# Patient Record
Sex: Male | Born: 1955 | Race: White | Hispanic: No | Marital: Married | Smoking: Current every day smoker
Health system: Southern US, Community
[De-identification: ages and names within clinical notes are randomized; demographics above are authoritative.]

## PROBLEM LIST (undated history)

## (undated) DIAGNOSIS — I2510 Atherosclerotic heart disease of native coronary artery without angina pectoris: Secondary | ICD-10-CM

## (undated) DIAGNOSIS — C649 Malignant neoplasm of unspecified kidney, except renal pelvis: Secondary | ICD-10-CM

## (undated) DIAGNOSIS — T7840XA Allergy, unspecified, initial encounter: Secondary | ICD-10-CM

## (undated) DIAGNOSIS — Z8551 Personal history of malignant neoplasm of bladder: Secondary | ICD-10-CM

## (undated) DIAGNOSIS — C679 Malignant neoplasm of bladder, unspecified: Secondary | ICD-10-CM

## (undated) DIAGNOSIS — Z0282 Encounter for adoption services: Secondary | ICD-10-CM

## (undated) DIAGNOSIS — Z85528 Personal history of other malignant neoplasm of kidney: Secondary | ICD-10-CM

## (undated) DIAGNOSIS — I209 Angina pectoris, unspecified: Secondary | ICD-10-CM

## (undated) DIAGNOSIS — E119 Type 2 diabetes mellitus without complications: Secondary | ICD-10-CM

## (undated) DIAGNOSIS — H269 Unspecified cataract: Secondary | ICD-10-CM

## (undated) DIAGNOSIS — Z789 Other specified health status: Secondary | ICD-10-CM

## (undated) DIAGNOSIS — I10 Essential (primary) hypertension: Secondary | ICD-10-CM

## (undated) HISTORY — DX: Allergy, unspecified, initial encounter: T78.40XA

## (undated) HISTORY — DX: Malignant neoplasm of bladder, unspecified: C67.9

## (undated) HISTORY — DX: Encounter for adoption services: Z02.82

## (undated) HISTORY — PX: CYSTOSCOPY: SHX5120

## (undated) HISTORY — DX: Malignant neoplasm of unspecified kidney, except renal pelvis: C64.9

## (undated) HISTORY — PX: NEPHRECTOMY: SHX65

## (undated) HISTORY — PX: BLADDER SURGERY: SHX569

## (undated) HISTORY — PX: INGUINAL HERNIA REPAIR: SUR1180

## (undated) HISTORY — DX: Other specified health status: Z78.9

## (undated) HISTORY — DX: Unspecified cataract: H26.9

## (undated) HISTORY — DX: Personal history of other malignant neoplasm of kidney: Z85.528

## (undated) HISTORY — DX: Personal history of malignant neoplasm of bladder: Z85.51

---

## 2001-08-08 ENCOUNTER — Encounter: Payer: Self-pay | Admitting: Urology

## 2001-08-08 ENCOUNTER — Encounter: Admission: RE | Admit: 2001-08-08 | Discharge: 2001-08-08 | Payer: Self-pay | Admitting: Urology

## 2001-08-14 ENCOUNTER — Observation Stay (HOSPITAL_COMMUNITY): Admission: RE | Admit: 2001-08-14 | Discharge: 2001-08-15 | Payer: Self-pay | Admitting: Urology

## 2001-08-14 ENCOUNTER — Encounter: Payer: Self-pay | Admitting: Urology

## 2001-08-14 ENCOUNTER — Encounter (INDEPENDENT_AMBULATORY_CARE_PROVIDER_SITE_OTHER): Payer: Self-pay | Admitting: Specialist

## 2001-09-11 ENCOUNTER — Encounter (INDEPENDENT_AMBULATORY_CARE_PROVIDER_SITE_OTHER): Payer: Self-pay | Admitting: Specialist

## 2001-09-11 ENCOUNTER — Observation Stay (HOSPITAL_COMMUNITY): Admission: RE | Admit: 2001-09-11 | Discharge: 2001-09-11 | Payer: Self-pay | Admitting: Urology

## 2001-11-20 ENCOUNTER — Encounter (INDEPENDENT_AMBULATORY_CARE_PROVIDER_SITE_OTHER): Payer: Self-pay | Admitting: Specialist

## 2001-11-20 ENCOUNTER — Ambulatory Visit (HOSPITAL_BASED_OUTPATIENT_CLINIC_OR_DEPARTMENT_OTHER): Admission: RE | Admit: 2001-11-20 | Discharge: 2001-11-20 | Payer: Self-pay | Admitting: Urology

## 2002-03-26 ENCOUNTER — Encounter (INDEPENDENT_AMBULATORY_CARE_PROVIDER_SITE_OTHER): Payer: Self-pay

## 2002-03-26 ENCOUNTER — Ambulatory Visit (HOSPITAL_BASED_OUTPATIENT_CLINIC_OR_DEPARTMENT_OTHER): Admission: RE | Admit: 2002-03-26 | Discharge: 2002-03-26 | Payer: Self-pay | Admitting: Urology

## 2003-01-14 ENCOUNTER — Ambulatory Visit (HOSPITAL_BASED_OUTPATIENT_CLINIC_OR_DEPARTMENT_OTHER): Admission: RE | Admit: 2003-01-14 | Discharge: 2003-01-14 | Payer: Self-pay | Admitting: Urology

## 2003-01-14 ENCOUNTER — Encounter (INDEPENDENT_AMBULATORY_CARE_PROVIDER_SITE_OTHER): Payer: Self-pay | Admitting: Specialist

## 2003-01-14 ENCOUNTER — Ambulatory Visit (HOSPITAL_COMMUNITY): Admission: RE | Admit: 2003-01-14 | Discharge: 2003-01-14 | Payer: Self-pay | Admitting: Urology

## 2004-08-17 ENCOUNTER — Encounter (INDEPENDENT_AMBULATORY_CARE_PROVIDER_SITE_OTHER): Payer: Self-pay | Admitting: *Deleted

## 2004-08-17 ENCOUNTER — Ambulatory Visit (HOSPITAL_COMMUNITY): Admission: RE | Admit: 2004-08-17 | Discharge: 2004-08-17 | Payer: Self-pay | Admitting: Urology

## 2006-05-30 ENCOUNTER — Ambulatory Visit (HOSPITAL_BASED_OUTPATIENT_CLINIC_OR_DEPARTMENT_OTHER): Admission: RE | Admit: 2006-05-30 | Discharge: 2006-05-30 | Payer: Self-pay | Admitting: Urology

## 2006-05-30 ENCOUNTER — Encounter (INDEPENDENT_AMBULATORY_CARE_PROVIDER_SITE_OTHER): Payer: Self-pay | Admitting: Specialist

## 2007-06-26 ENCOUNTER — Ambulatory Visit (HOSPITAL_BASED_OUTPATIENT_CLINIC_OR_DEPARTMENT_OTHER): Admission: RE | Admit: 2007-06-26 | Discharge: 2007-06-26 | Payer: Self-pay | Admitting: Urology

## 2007-06-26 ENCOUNTER — Encounter (INDEPENDENT_AMBULATORY_CARE_PROVIDER_SITE_OTHER): Payer: Self-pay | Admitting: Urology

## 2009-01-10 ENCOUNTER — Inpatient Hospital Stay (HOSPITAL_BASED_OUTPATIENT_CLINIC_OR_DEPARTMENT_OTHER): Admission: RE | Admit: 2009-01-10 | Discharge: 2009-01-10 | Payer: Self-pay | Admitting: Cardiology

## 2010-07-05 ENCOUNTER — Other Ambulatory Visit (HOSPITAL_COMMUNITY): Payer: Self-pay | Admitting: Urology

## 2010-07-05 ENCOUNTER — Ambulatory Visit (HOSPITAL_COMMUNITY)
Admission: RE | Admit: 2010-07-05 | Discharge: 2010-07-05 | Disposition: A | Discharge status: Home / Self Care | Payer: BC Managed Care – PPO | Source: Ambulatory Visit | Attending: Urology | Admitting: Urology

## 2010-07-05 DIAGNOSIS — D49519 Neoplasm of unspecified behavior of unspecified kidney: Secondary | ICD-10-CM

## 2010-07-05 DIAGNOSIS — D4959 Neoplasm of unspecified behavior of other genitourinary organ: Secondary | ICD-10-CM | POA: Insufficient documentation

## 2010-07-05 DIAGNOSIS — I10 Essential (primary) hypertension: Secondary | ICD-10-CM | POA: Insufficient documentation

## 2010-07-17 ENCOUNTER — Encounter (HOSPITAL_COMMUNITY): Payer: BC Managed Care – PPO

## 2010-07-17 ENCOUNTER — Other Ambulatory Visit: Payer: Self-pay | Admitting: Urology

## 2010-07-17 DIAGNOSIS — Z79899 Other long term (current) drug therapy: Secondary | ICD-10-CM | POA: Insufficient documentation

## 2010-07-17 DIAGNOSIS — D4959 Neoplasm of unspecified behavior of other genitourinary organ: Secondary | ICD-10-CM | POA: Insufficient documentation

## 2010-07-17 DIAGNOSIS — I10 Essential (primary) hypertension: Secondary | ICD-10-CM | POA: Insufficient documentation

## 2010-07-17 DIAGNOSIS — Z01812 Encounter for preprocedural laboratory examination: Secondary | ICD-10-CM | POA: Insufficient documentation

## 2010-07-17 DIAGNOSIS — Z8551 Personal history of malignant neoplasm of bladder: Secondary | ICD-10-CM | POA: Insufficient documentation

## 2010-07-17 DIAGNOSIS — Z01810 Encounter for preprocedural cardiovascular examination: Secondary | ICD-10-CM | POA: Insufficient documentation

## 2010-07-17 LAB — CBC
HCT: 42.7 % (ref 39.0–52.0)
Hemoglobin: 14.9 g/dL (ref 13.0–17.0)
MCH: 31.6 pg (ref 26.0–34.0)
MCHC: 34.9 g/dL (ref 30.0–36.0)
MCV: 90.7 fL (ref 78.0–100.0)
Platelets: 197 10*3/uL (ref 150–400)
RBC: 4.71 MIL/uL (ref 4.22–5.81)
RDW: 13 % (ref 11.5–15.5)
WBC: 8.8 10*3/uL (ref 4.0–10.5)

## 2010-07-17 LAB — BASIC METABOLIC PANEL
BUN: 7 mg/dL (ref 6–23)
CO2: 28 mEq/L (ref 19–32)
Calcium: 9.3 mg/dL (ref 8.4–10.5)
Chloride: 103 mEq/L (ref 96–112)
Creatinine, Ser: 0.94 mg/dL (ref 0.4–1.5)
GFR calc Af Amer: >60 mL/min (ref >60)
GFR calc non Af Amer: >60 mL/min (ref >60)
Glucose, Bld: 111 mg/dL — ABNORMAL HIGH (ref 70–99)
Potassium: 3.5 mEq/L (ref 3.5–5.1)
Sodium: 140 mEq/L (ref 135–145)

## 2010-07-17 LAB — SURGICAL PCR SCREEN: Staphylococcus aureus: POSITIVE — AB

## 2010-07-24 ENCOUNTER — Inpatient Hospital Stay (HOSPITAL_COMMUNITY)
Admission: RE | Admit: 2010-07-24 | Discharge: 2010-07-27 | DRG: 303 | Disposition: A | Discharge status: Home / Self Care | Payer: BC Managed Care – PPO | Source: Ambulatory Visit | Attending: Urology | Admitting: Urology

## 2010-07-24 ENCOUNTER — Other Ambulatory Visit: Payer: Self-pay | Admitting: Urology

## 2010-07-24 DIAGNOSIS — Z01818 Encounter for other preprocedural examination: Secondary | ICD-10-CM

## 2010-07-24 DIAGNOSIS — Z8551 Personal history of malignant neoplasm of bladder: Secondary | ICD-10-CM

## 2010-07-24 DIAGNOSIS — F17200 Nicotine dependence, unspecified, uncomplicated: Secondary | ICD-10-CM | POA: Diagnosis present

## 2010-07-24 DIAGNOSIS — C649 Malignant neoplasm of unspecified kidney, except renal pelvis: Principal | ICD-10-CM | POA: Diagnosis present

## 2010-07-24 DIAGNOSIS — I10 Essential (primary) hypertension: Secondary | ICD-10-CM | POA: Diagnosis present

## 2010-07-24 LAB — TYPE AND SCREEN
ABO/RH(D): O POS
Antibody Screen: NEGATIVE

## 2010-07-24 LAB — HEMOGLOBIN AND HEMATOCRIT, BLOOD
HCT: 39.9 % (ref 39.0–52.0)
Hemoglobin: 13.8 g/dL (ref 13.0–17.0)

## 2010-07-24 LAB — BASIC METABOLIC PANEL
BUN: 10 mg/dL (ref 6–23)
CO2: 27 mEq/L (ref 19–32)
Calcium: 8.4 mg/dL (ref 8.4–10.5)
Chloride: 101 mEq/L (ref 96–112)
Creatinine, Ser: 1.2 mg/dL (ref 0.4–1.5)
GFR calc Af Amer: >60 mL/min (ref >60)
GFR calc non Af Amer: >60 mL/min (ref >60)
Glucose, Bld: 178 mg/dL — ABNORMAL HIGH (ref 70–99)
Potassium: 4 mEq/L (ref 3.5–5.1)
Sodium: 136 mEq/L (ref 135–145)

## 2010-07-24 LAB — ABO/RH: ABO/RH(D): O POS

## 2010-07-25 LAB — BASIC METABOLIC PANEL
BUN: 10 mg/dL (ref 6–23)
CO2: 26 mEq/L (ref 19–32)
Calcium: 8.3 mg/dL — ABNORMAL LOW (ref 8.4–10.5)
Chloride: 101 mEq/L (ref 96–112)
Creatinine, Ser: 1.48 mg/dL (ref 0.4–1.5)
GFR calc Af Amer: 59 mL/min — ABNORMAL LOW (ref >60)
GFR calc non Af Amer: 49 mL/min — ABNORMAL LOW (ref >60)
Glucose, Bld: 177 mg/dL — ABNORMAL HIGH (ref 70–99)
Potassium: 4.6 mEq/L (ref 3.5–5.1)
Sodium: 134 mEq/L — ABNORMAL LOW (ref 135–145)

## 2010-07-25 LAB — HEMOGLOBIN AND HEMATOCRIT, BLOOD
HCT: 39.2 % (ref 39.0–52.0)
Hemoglobin: 13.2 g/dL (ref 13.0–17.0)

## 2010-07-26 LAB — BASIC METABOLIC PANEL
BUN: 10 mg/dL (ref 6–23)
CO2: 26 mEq/L (ref 19–32)
Calcium: 8.3 mg/dL — ABNORMAL LOW (ref 8.4–10.5)
Chloride: 100 mEq/L (ref 96–112)
Creatinine, Ser: 1.44 mg/dL (ref 0.4–1.5)
GFR calc Af Amer: >60 mL/min (ref >60)
GFR calc non Af Amer: 51 mL/min — ABNORMAL LOW (ref >60)
Glucose, Bld: 162 mg/dL — ABNORMAL HIGH (ref 70–99)
Potassium: 3.8 mEq/L (ref 3.5–5.1)
Sodium: 134 mEq/L — ABNORMAL LOW (ref 135–145)

## 2010-07-26 LAB — HEMOGLOBIN AND HEMATOCRIT, BLOOD
HCT: 37.3 % — ABNORMAL LOW (ref 39.0–52.0)
Hemoglobin: 12.3 g/dL — ABNORMAL LOW (ref 13.0–17.0)

## 2010-07-26 LAB — CREATININE, FLUID (PLEURAL, PERITONEAL, JP DRAINAGE): Creat, Fluid: 1.4 mg/dL

## 2010-07-27 ENCOUNTER — Inpatient Hospital Stay (HOSPITAL_COMMUNITY): Payer: BC Managed Care – PPO

## 2010-08-01 NOTE — Op Note (Signed)
Grant Watkins, Grant Watkins NO.:  [id number]   MEDICAL RECORD NO.:  [id number]          PATIENT TYPE:  AMB   LOCATION:  [facility]                         FACILITY:  [facility]   PHYSICIAN:  Heloise Purpura, MD      DATE OF BIRTH:  1955/05/24   DATE OF PROCEDURE:  06/26/2007  DATE OF DISCHARGE:                               OPERATIVE REPORT   PREOPERATIVE DIAGNOSIS:  Bladder cancer.   POSTOPERATIVE DIAGNOSIS:  Bladder cancer.   PROCEDURE:  1. Cystoscopy.  2. Bilateral retrograde pyelography.  3. Examination under anesthesia.  4. Transurethral resection of bladder tumor (1 cm).   SURGEON:  Heloise Purpura, MD   ASSISTANT:  Dr. Delman Kitten.   ANESTHESIA:  General.   COMPLICATIONS:  None.   ESTIMATED BLOOD LOSS:  Minimal.   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  On surveillance  cystoscopy, he was recently found to have a solitary recurrence.  This  did appear to be a low grade lesion and initially at the patient's  request was monitored.  This lesion did increase in size on his last  cystoscopy and he did elect to proceed with transurethral resection and  the above procedures.  Potential risks, complications, and alternative  options were discussed with the patient in detail and informed consent  was obtained.   DESCRIPTION OF PROCEDURE:  The patient was taken to the operating room  and a general anesthetic was administered.  He was placed in the dorsal  lithotomy position, administered preoperative antibiotics, and prepped  and draped in the usual sterile fashion.  A preoperative time-out was  then performed.  Cystourethroscopy was then performed which demonstrated  a normal anterior and posterior urethra.  Examination of the bladder  demonstrated the ureteral orifices to be in a normal anatomic position  and effluxing clear urine.  Examination of the bladder was then  systematically  performed and demonstrated a solitary papillary lesion on  the right anterior aspect of the bladder toward the bladder neck at  approximately 11 o'clock.  This lesion measured approximately 1 cm.  There were no other bladder tumors or other mucosal pathology identified  and there was no evidence for carcinoma in situ.  He did undergo a  previous bladder washing in the office that was performed that was  analyzed for cytology and was negative for malignancy.  Attention then  turned to the ureteral orifices and bilateral retrograde pyelography was  performed.  The right ureteral orifice was intubated with a cone-tip  catheter and contrast was injected.  There was no evidence of filling  defects or other abnormalities along the right ureter or collecting  system of the right kidney.  An identical procedure was then performed  on contralateral side and again no filling defects, dilation, or other  abnormalities were noted along the course of the ureter or left renal  collecting system.  Attention then returned to the bladder and the 24-  French resectoscope sheath was inserted into the patient's bladder.  Using a cutting  loop, the patient's bladder tumor was then  transurethrally resected including resection down into the muscularis  propria of the bladder.  This specimen was removed and sent off for  permanent pathologic analysis.  Reinspection of the bladder did not  demonstrate any significant bleeding.  The base of the bladder tumor was  then fulgurated and the bladder was emptied and reinspected and there  was no evidence of bleeding.  The patient's bladder was then emptied and  an examination under anesthesia was performed which did not demonstrate  any pelvic or bladder mass.  The patient appeared to tolerate the  procedure well without complications.  A 20-French catheter was inserted  into his bladder at the end of the procedure and following transfer to  the recovery unit, he did  have a postoperative installation EOquin  versus placebo 40 mL instilled via his Foley catheter into the bladder  which was left indwelling for 1 hour.      Heloise Purpura, MD  Electronically Signed     LB/MEDQ  D:  06/26/2007  T:  06/26/2007  Job:  [id number]

## 2010-08-04 NOTE — Op Note (Signed)
[facility]  Patient:    Grant Watkins, Grant Watkins Visit Number: [id number] MRN: [id number]          Service Type: SUR Location: 3W 0378 02 Attending Physician:  Monica Becton Dictated by:   Claudette Laws, M.D. Proc. Date: 08/14/01 Admit Date:  08/14/2001 Discharge Date: 08/15/2001                             Operative Report  PREOPERATIVE DIAGNOSIS:  Large, bulky papillary bladder carcinoma.  POSTOPERATIVE DIAGNOSIS:  Large, bulky papillary bladder carcinoma.  OPERATION:  Cystoscopy and transurethral resection and fulguration bladder tumor.  SURGEON:  Claudette Laws, M.D.  HISTORY:  This is a 55 year old, otherwise healthy gentleman, who presented with an episode of painless gross hematuria.  IVP revealed large filling defect occupying most of the bladder, normal upper tracts.  Subsequent CT scan showed a good 5 cm tumor along the right lateral wall with no hydronephrosis, no obvious adenopathy.  The patient comes in now for transurethral resection and further staging of the tumor.  He understands that because of the bulk of the tumor, it may take more than one procedure to resect it and also get down to bladder muscle.  The patient otherwise is in good health, although he is a heavy smoker.  DESCRIPTION OF PROCEDURE:  The patient was prepped and draped in the dorsal lithotomy position under intubated general anesthesia.  Bimanual exam did not reveal any bladder mass that I could palpate.  No obvious extension or fixed pelvis.  Cystoscopy revealed a normal anterior urethra and nonobstructing prostate.  Examination of the bladder revealed the previously described tumor which occupied most of the right lateral wall, extending over into the midline and I believe down to the right ureteral orifice.  There were some dilated vessels in this area.  The rest of the bladder looked normal.  Starting out the the Olympus continuous-flow resectoscope sheath  and the Erbe coagulator, transurethral resection ensued.  After about 1 hour and 10 minutes because of the fatigue factor and also some malfunctioning of the cutting loop on the Erbe, we elected to terminate the procedure.  I then converted over to a roller ball electrode and fulgurated as much of the tumor as I could.  It appeared to be a broad-based tumor but, at this point, I am not sure whether it is invasive or not.  A #22 French 5 cc Foley catheter was hooked to straight drain; it irrigated fairly clear.  A B&O suppository was placed per rectum for bladder spasms, and the patient was then taken back to the recovery room in satisfactory condition.  The bulky, superficial part of the tumor was sent for pathologic examination. Dictated by:   Claudette Laws, M.D. Attending Physician:  Monica Becton DD:  08/14/01 TD:  08/15/01 Job: [id number]

## 2010-08-04 NOTE — Discharge Summary (Signed)
[facility]  Patient:    Grant Watkins, Grant Watkins Visit Number: [id number] MRN: [id number]          Service Type: SUR Location: 3W 0378 02 Attending Physician:  Monica Becton Dictated by:   Claudette Laws, M.D. Admit Date:  08/14/2001 Discharge Date: 08/15/2001   CC:         Jamesetta Geralds, M.D.   Discharge Summary  HISTORY OF PRESENT ILLNESS:  This is a 55 year old gentleman who recently presented with an episode of painless, gross hematuria.  Urology workup confirmed a large bladder tumor occupying the right lateral wall.  Upper tracts were normal on IVP.  A preop CT scan showed no obvious adenopathy, no obvious invasion through the bladder wall.  The fat planes appeared normal. The patient otherwise is in good health accept for being a cigarette smoker x30 years.  He came in as an a.m. admission for overnight observation following TUR of his bladder tumor.  LABORATORY DATA AND X-RAY FINDINGS:  EKG showed normal sinus rhythm.  Chest x-ray report is not on the chart at the time of this dictation.  Electrolytes were normal with a BUN of 17 and creatinine 1.2.  Hemoglobin 16.7, hematocrit 47.1.  HOSPITAL COURSE:  The patient came in on Aug 14, 2001, and underwent transurethral and fulguration of a very large, 5-6 cm bladder tumor.  We debulked the tumor as much as we could and then at the end finished with with fulguration with a roller ball.  At this time, it was felt for technical reasons, that we should abandon the procedure and bring in back in a few weeks for further staging to include any evidence of obvious muscle invasion.  This was a large papillary tumor, broad based and I am still unclear if there is muscle invasion or not.  The patient was observed over night.  He had some bladder spasm, but by the next morning the urine was clear.  He was then sent home early in the morning on Aug 15, 2001, with instructions to remove the Foley catheter  himself in another 24 hours.  I will then make plans to bring him back in another two to three weeks to complete his resection.  He was encouraged to stop smoking in the meantime and use the incentive spirometry.  DISCHARGE DIAGNOSES: 1. Large papillary tumor, right lateral low, stage and grade pending. 2. Heavy cigarette smoker.  PROCEDURES: 1. Cystoscopy. 2. Transurethral resection and fulguration of large bladder tumor.  COMPLICATIONS:  None.  CONDITION ON DISCHARGE:  Recovering.  DISCHARGE MEDICATIONS: 1. Cipro 250 mg b.i.d. 2. Pyridium 200 mg #20 p.r.n. burning. 3. Tylox #25 p.r.n. pain. 4. Oxybutynin 5 mg b.i.d., #20 for bladder spasms.  DIET:  Regular diet.  Force fluids.  ACTIVITY:  Limit activity.  SPECIAL INSTRUCTIONS:  Remove the catheter in 24 hours himself and then return to see me in three weeks for followup and set up the followup surgery. Dictated by:   Claudette Laws, M.D. Attending Physician:  Monica Becton DD:  08/15/01 TD:  08/18/01 Job: [id number]

## 2010-08-04 NOTE — Op Note (Signed)
Grant Watkins, CAPPELLA              ACCOUNT NO.:  [id number]   MEDICAL RECORD NO.:  [id number]          PATIENT TYPE:  AMB   LOCATION:  DAY                          FACILITY:  [facility]   PHYSICIAN:  Claudette Laws, M.D.  DATE OF BIRTH:  Nov 19, 1955   DATE OF PROCEDURE:  08/17/2004  DATE OF DISCHARGE:                                 OPERATIVE REPORT   PREOPERATIVE DIAGNOSES:  1.  Medium size papillary bladder tumor dome of the bladder.  2.  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  3.  Heavy cigarette smoker.   POSTOPERATIVE DIAGNOSES:  1.  Medium size papillary bladder tumor dome of the bladder.  2.  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  3.  Heavy cigarette smoker.   OPERATION:  Cystoscopy, cold cup biopsy bladder tumor and transurethral  resection bladder tumor with the Bugbee electrode.   SURGEON:  Claudette Laws, M.D.   DESCRIPTION OF PROCEDURE:  The patient was prepped and draped in the dorsal  lithotomy position under intubated general anesthesia. Cystoscopy was  performed with the 22 French rigid cystoscope using the 12 degree and 70  degree lens. He had a normal anterior urethra, a prostate that was not  obstructing but with a high posterior lip. The bladder itself was normal  except for this medium sized tumor right at the dome of the bladder just  inside the bladder neck area. This appeared to be a superficial papillary  lesion. The rest of the bladder looked normal, smooth, no other suspicious  areas, normal ureteral orifices.   Initially using the cold cup forceps, we performed some biopsies. This tumor  proved to be somewhat inaccessible due to its location. However, we did  obtain some specimens. I then switched over to a Bugbee electrode and at the  25 degree coag and using the Aberrans lens, we were able to access the tumor  with suprapubic pressure and extensively fulgurated this  tumor right down to  its base. At the conclusion the irrigant was clear.   I then inserted a 22 French three-way Foley catheter, drained the bladder  and then instilled the 40 mg of mitomycin C in 40 mL of sterile water. The  catheter ports were plugged and he was taken back to the PACU in  satisfactory condition.   The plan is to keep the installation in for one hour and then irrigate the  bladder with 1 liter of saline and then remove the Foley and send him home.   Also preoperatively, he was given a B&O suppository for anesthetic purposes.      RFS/MEDQ  D:  08/17/2004  T:  08/17/2004  Job:  [id number]

## 2010-08-04 NOTE — Op Note (Signed)
TNAMEKHAMERON, GRUENWALD                         ACCOUNT NO.:  [id number]   MEDICAL RECORD NO.:  [id number]                   PATIENT TYPE:  AMB   LOCATION:  [facility]                                 FACILITY:  [facility]   PHYSICIAN:  Claudette Laws, M.D.               DATE OF BIRTH:  Mar 10, 1956   DATE OF PROCEDURE:  11/20/2001  DATE OF DISCHARGE:                                 OPERATIVE REPORT   PREOPERATIVE DIAGNOSES:  1. Recurrent papillary bladder tumor.  2. Status post transurethral resection bladder tumor x 2.   POSTOPERATIVE DIAGNOSES:  1. Recurrent papillary bladder tumor.  2. Status post transurethral resection bladder tumor x 2.   OPERATION:  1. Cystoscopy.  2. Cold cup biopsy, bladder tumor left lateral wall and transurethral     resection.   SURGEON:  Claudette Laws, M.D.   DESCRIPTION OF PROCEDURE:  The patient was prepped and draped in the dorsal  lithotomy position.  Under LMA anesthesia, a 22 French cystoscope was used  with the camera.  Cystoscopy showed a normal anterior urethra, a small,  nonobstructing prostate, some elevation of the posterior lip.  The bladder  itself showed a medium-size bladder tumor along the left lateral wall which  I thought was a new finding.  He also had some dystrophic changes along the  right lateral wall over the old TUR site.  There was a tumor just inside the  trigone, below the right ureteral orifice.  On this occasion, I was able to  identify both orifices without any difficulty.  Appropriate pictures were  taken.  The rest of the bladder looked normal, smooth.  No other tumors were  noted, and he appeared to have good results so far to date, considering the  size of the original tumor.   Initially, I cold cupped the tumor from the left lateral wall and then after  dilating him with Sissy Hoff sounds to a #28 [place], I put in a 27 [place]  continuous-flow resectoscope sheath and using the right-angle loop, resected  the tumor deep  into the muscle and then fulgurated the area around it.  Our  attention was then turned the confluent, flat area distal to the right  ureteral orifice which we fulgurated.  This appeared to be a superficial low-  grade tumor.  There was still some dystrophic-type calcification above the  right ureteral orifice, and we fulgurated this area gently and removed the  eschar.  At the conclusion, I put an 4 French 10 cc Foley catheter along  with some Xylocaine jelly per urethra.  A B&O suppository was placed per  rectum for bladder spasm.  The patient was then taken back to the recovery  room in satisfactory condition.  Claudette Laws, M.D.    RFS/MEDQ  D:  11/20/2001  T:  11/20/2001  Job:  [id number]

## 2010-08-04 NOTE — Op Note (Signed)
   Grant Watkins, REETZ                          ACCOUNT NO.:  [id number]   MEDICAL RECORD NO.:  [id number]                   PATIENT TYPE:  AMB   LOCATION:  [facility]                                 FACILITY:  [facility]   PHYSICIAN:  Claudette Laws, M.D.               DATE OF BIRTH:  10-02-1955   DATE OF PROCEDURE:  03/26/2002  DATE OF DISCHARGE:                                 OPERATIVE REPORT   PREOPERATIVE DIAGNOSIS:  Recurrent papillary bladder carcinoma.   POSTOPERATIVE DIAGNOSES:  1. Recurrent papillary bladder carcinoma.  2. Status post six weeks of BCG plus Entron.   PROCEDURES:  1. Cystoscopy  2. Cold cup biopsy, bladder tumor.  3. Fulguration of multiple papillary lesions.   DESCRIPTION OF PROCEDURE:  The patient was prepped and draped in the dorsal  lithotomy position under LMA anesthesia after injecting a B&O suppository  per rectum for anesthetic purposes.  Cystourethroscopy was performed with a  20 French cystoscope using a camera.  He had a normal anterior urethra, a  small nonobstructing prostate, a wide-open bladder neck.  The bladder itself  showed evidence of prior resections from his multiple TUR's.  However, he  was noted to have four to five papillary lesions, three up near the dome  along the right lateral wall, one more posteriorly in the midline, and one  at the 9 o'clock position just inside the bladder neck area.  We biopsied  one of these lesions.  They all appeared to be superficial lesions.  We then  using the Bugbee electrode at 40 coag fulgurated all these lesions using the  camera.  The rest of the bladder looked normal.  No other suspicious areas.  Normal ureteral orifices.  At the conclusion, the bladder was emptied.  I  injected Xylocaine jelly per urethra for anesthetic purposes again, and he  was taken back to the recovery room in satisfactory condition.   The plan now is to put him back on this bladder study with BCG, Entron, and  multiple  vitamins.                                                 Claudette Laws, M.D.    RFS/MEDQ  D:  03/26/2002  T:  03/26/2002  Job:  [id number]

## 2010-08-04 NOTE — Op Note (Signed)
[facility]  Patient:    DALESSANDRO, BALDYGA Visit Number: [id number] MRN: [id number]          Service Type: SUR Location: 3W 0352 01 Attending Physician:  Monica Becton Dictated by:   Claudette Laws, M.D. Proc. Date: 09/11/01 Admit Date:  09/11/2001 Discharge Date: 09/11/2001                             Operative Report  PREOPERATIVE DIAGNOSIS:  Persistent medium-sized papillary bladder tumor, right lateral wall.  POSTOPERATIVE DIAGNOSIS:  Persistent medium-sized papillary bladder tumor, right lateral wall.  OPERATION: 1. Cystoscopy. 2. Transurethral resection and fulguration bladder tumor.  SURGEON:  Claudette Laws, M.D.  HISTORY:  This is a 55 year old man, who was here about a month ago and found to have a large papillary tumor, occupying the right lateral wall of the bladder and extending into the lumen about halfway.  We resected out as much as we could at that time comfortably.  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  He comes back now to complete the resection, hopefully at the second setting.  He understands the procedure and complications, including possible bladder perforation and bleeding.  DESCRIPTION OF PROCEDURE:  The patient was prepped and draped in the dorsal lithotomy position under LMA anesthesia.  Cystoscopy was performed, again showing some persistent tumor along the right lateral wall, extending just above the right ureteral orifice, up not quite to the dome of the bladder. Appropriate pictures were taken with the Sony camera.  We then converted over to an Olympus continuous-flow resectoscope sheath along with the Erbe generator.  The initial setting was 120 with a #1 effect.  Transurethral resection was then performed, taking the rest of the tumor down to the bladder wall.  This again appeared to be a superficial lesion, at least cystoscopically.  Three  different specimens were sent.  Number one was the initial superficial area.  Number two was a little deeper toward the base of the tumor, and then number three was into the muscle of the bladder.  At the end, we fulgurated all areas.  There was minimal bleeding.  A #20 French 8 cc Foley catheter was hooked to straight drain, and a B&O suppository was placed per rectum for bladder spasms.  The patient was then taken back to the recovery room in satisfactory condition. Dictated by:   Claudette Laws, M.D. Attending Physician:  Monica Becton DD:  09/11/01 TD:  09/13/01 Job: [id number]

## 2010-08-04 NOTE — Op Note (Signed)
Grant Watkins, Grant Watkins              ACCOUNT NO.:  [id number]   MEDICAL RECORD NO.:  [id number]          PATIENT TYPE:  AMB   LOCATION:  [facility]                         FACILITY:  [facility]   PHYSICIAN:  Heloise Purpura, MD      DATE OF BIRTH:  [birth year]   DATE OF PROCEDURE:  05/30/2006  DATE OF DISCHARGE:                               OPERATIVE REPORT   PREOPERATIVE DIAGNOSIS:  1. Prostate nodule.  2. History of bladder cancer with bladder tumor recurrence.   POSTOPERATIVE DIAGNOSIS:  1. Prostate nodule.  2. History of bladder cancer with bladder tumor recurrence.   PROCEDURE:  1. Cystoscopy.  2. Bladder biopsy.  3. Transrectal ultrasound of the prostate.  4. Prostate needle biopsy.   SURGEON:  Heloise Purpura, M.D.   ANESTHESIA:  General.   COMPLICATIONS:  None.   ESTIMATED BLOOD LOSS:  Minimal.   PATHOLOGY SPECIMENS:  1. Bladder biopsy.  2. Prostate biopsies from the left apex, left mid, left base, right      apex, right mid, and right base.  All specimens were sent to      pathology.   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  He has been managed  with intermittent transurethral resection by Dr. Etta Watkins in the past.  On  recent surveillance cystoscopy, I did notice a possible recurrence along  the left lateral bladder wall.  In addition, he was noted to have a  prostate nodule.  His PSA was 0.8.  After discussing these findings with  the patient, he elected to proceed with the above procedures.  The  potential risks and benefits were discussed with the patient and he  consented.   DESCRIPTION OF PROCEDURE:  The patient was taken to the operating room  and a general anesthetic was administered.  He was given preoperative  antibiotics, placed in the dorsal lithotomy position, prepped and draped  in the usual sterile fashion.  Next, a preoperative time-out was  performed.  Cystourethroscopy was  performed which demonstrated a normal  anterior and posterior urethra with mild lateral lobe enlargement of the  prostate.  The ureteral orifices were noted to be in the normal anatomic  position and effluxing clear urine.  The bladder was then examined  systematically.  There was noted to be a small papillary bladder tumor  on the left lateral wall of the bladder.  The remainder of the bladder  appeared to be free of tumors or other mucosal abnormalities.  The cold  cup biopsy forceps were then used to resect this entire lesion.  The  Bugbee electrode was then used fulgurate the base of this lesion and the  surrounding areas.  The patient's bladder was then emptied and attention  turned to the prostate biopsy.  The transrectal ultrasound probe was  placed into the rectum and the prostate was visualized.  There was noted  to be a large calcification toward the right mid/base of the prostate.  This corresponded to the side of the patient's abnormality on rectal  exam.  12 systematic biopsies were performed from the prostate at the  right base, right mid, right apex, left base, left mid, and left apex.  Prostate volume was measured at 27.6 mL.  The patient appeared to  tolerate this procedure well without complications.  Sterile gloves were  then replaced. I again performed cystoscopy to re-examine the patient's  bladder.  There was one small blood clot which was removed.  The bladder  was then emptied and the procedure was ended.  The patient appeared to  tolerate the procedure well without complications.  He was able to be  awakened and transferred to the recovery unit in satisfactory condition.           ______________________________  Heloise Purpura, MD  Electronically Signed     LB/MEDQ  D:  05/30/2006  T:  05/31/2006  Job:  [id number]

## 2010-08-04 NOTE — Op Note (Signed)
Grant Watkins, Grant Watkins                          ACCOUNT NO.:  [id number]   MEDICAL RECORD NO.:  [id number]                   PATIENT TYPE:  AMB   LOCATION:  [facility]                                 FACILITY:  [facility]   PHYSICIAN:  Claudette Laws, M.D.               DATE OF BIRTH:  1955/12/03   DATE OF PROCEDURE:  01/14/2003  DATE OF DISCHARGE:                                 OPERATIVE REPORT   PREOPERATIVE DIAGNOSES:  1. Multiple bladder tumors.  2. 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  3. Status post BCG/Intron intravesical instillations.   POSTOPERATIVE DIAGNOSES:  1. Multiple bladder tumors.  2. 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  3. Status post BCG/Intron intravesical instillations.   OPERATION:  1. Cystoscopy and cold-cut biopsy of bladder tumors.  2. Transurethral resection of bladder tumors.   SURGEON:  Claudette Laws, M.D.   PROCEDURE:  The patient was prepped and draped in the dorsal lithotomy  position under LMA anesthesia.  Cystoscopy was performed, revealing a normal  anterior urethra, some early BPH.  He had a papillary-like lesion at the 3  o'clock position, just inside the bladder neck area.  This appeared to be on  a stalk.  There was also a larger (about 2.5 cm) tumor up around the 11  o'clock position; again, just up the bladder neck area.  The rest of the  bladder looked normal.  The ureteral orifices were unremarkable.   Initially, using a cold-cut biopsy, I biopsied the lesion near the dome in  the bladder neck area.  This was labeled #1.  Then #2 was the superficial  tumor at the bladder neck area.   I then dilated the urethra with Sissy Hoff sounds.  He did have a slight  false passage at about the 3 o'clock position in the mid urethral area -- we  were able to bypass this.  Following dilation with Sissy Hoff sounds to a 32-  [place], a  26-French continuous flow Olympus scope resectoscope sheath was  then placed into the bladder.  Initially I resected out the lesion at the 3  o'clock position.  This seemed to be, again, a superficial area.  I  fulgurated the base thoroughly for about one centimeter around it.  We did  resect some of the bladder neck area in getting out this lesion.   Our attention was then turned to the lesion up near the dome, just inside  the bladder neck at about the 11 o'clock position.  Again, this was more  difficult to reach.  I then switched to a bladder wall loop, resected out  this area.  We did take some deep bites into the muscle.  I fulgurated the  base.   At this point I thought we had fulgurated the  area thoroughly, so a 22-  French 10 cc Foley catheter was placed into the bladder.  It irrigated well.  Slightly bloody urine.  At this point I thought I would put in mitomycin C,  but I thought I would wait for now since he had some bleeding and possibly  instill it at another date.  The patient was then taken back to the recovery  room in satisfactory condition, after placing a B&O suppository for bladder  spasm.                                               Claudette Laws, M.D.    RFS/MEDQ  D:  01/14/2003  T:  01/14/2003  Job:  [id number]

## 2010-08-09 NOTE — Op Note (Signed)
[name], [name]              ACCOUNT NO.:  [id number]  MEDICAL RECORD NO.:  [id number]           PATIENT TYPE:  I  LOCATION:  1442                         FACILITY:  [facility]  PHYSICIAN:  Heloise Purpura, MD      DATE OF BIRTH:  May 17, 1955  DATE OF PROCEDURE:  DATE OF DISCHARGE:  07/27/2010                              OPERATIVE REPORT   PREOPERATIVE DIAGNOSES: 1. Right renal neoplasm. 2. History of urothelial carcinoma of the bladder.  POSTOPERATIVE DIAGNOSES: 1. Right renal neoplasm. 2. History of urothelial carcinoma of the bladder.  PROCEDURE:  Right laparoscopic nephroureterectomy.  SURGEON:  Heloise Purpura, M.D.  ASSISTANT:  Delia Chimes, [facility]  ANESTHESIA:  General.  COMPLICATIONS:  None.  ESTIMATED BLOOD LOSS:  300 cc.  SPECIMEN:  Right kidney, ureter and bladder cuff.  DISPOSITION:  Specimen to pathology.  DRAINS:  #19 Blake pelvic drain.  INDICATION:  [name] is a 55 year old gentleman with a history of urothelial carcinoma of the bladder.  He recently underwent staging evaluation of his upper urinary tract including a CT scan, which revealed an enhancing right renal mass concerning for renal malignancy. He underwent metastatic evaluation which was negative and after reviewing treatment options, he elected to proceed with a right laparoscopic radical nephrectomy.  However, based on his history of urothelial carcinoma, I did recommend removal of his right ureter as well so as to make surveillance for his urothelial carcinoma easier in the future.  The potential risks, complications, and alternative treatment options associated with the above procedure were discussed in detail and informed consent was obtained.  DESCRIPTION OF PROCEDURE:  The patient was taken to the operating room and a general anesthetic was administered.  He was given preoperative antibiotics, placed in the right modified flank position, and prepped and draped in the usual sterile  fashion.  Care was taken to pad all potential pressure points.  Next, preoperative time-out was performed. A site was selected just superior to the umbilicus for placement of the camera port.  This was placed using a standard open Hasson technique, which allowed entry into the peritoneal cavity under direct vision without difficulty.  0 Vicryl holding sutures were then placed in the rectus fascia and a 12-mm Hasson cannula was placed and secured with these sutures.  Using the 30-degree lens, the abdomen was inspected and there was no evidence of any intraabdominal injuries or other abnormalities.  The remaining ports were then placed with a 5 mm port placed in the right upper quadrant and 12-mm port placed in the right lower quadrant and a 5 mm port placed in the right lateral abdominal wall for liver retraction.  A self-retaining liver retractor was placed due to the fact that this did overlie almost the entire kidney.  The white line of Toldt was then incised along the length of the ascending colon using the harmonic ultrasonic shears.  This allowed the space between the colonic mesentery and the anterior layer of Gerota fascia to be developed.  The ureter was identified and was able to be lifted anteriorly and the posterior plane between the kidney and the psoas muscle  was developed.  Dissection then proceeded superiorly toward the main renal hilum.  There were noted to be small accessory renal veins, which were identified.  There was some bleeding noted from one of these veins initially, which was controlled with 5-mm Hem-o-lok clips. Further dissection of the renal hilum allowed the main renal artery and renal vein to be isolated.  The renal artery was then ligated and divided between multiple 10-mm Hem-o-lok clips.  The renal vein was stapled and divided with a 45 mm Flex ETS stapler.  The hepatorenal ligaments and posterior attachments of the kidney as well as the  lateral attachments of the kidney were then taken down with a harmonic scalpel, thereby freeing the kidney specimen.  The renal fossa and renal hilar areas were then examined and hemostasis appeared excellent at this point.  Attention then turned to the ureter.  The  ureter was further dissected down to a point were crossed the iliac vessels.  At this point, the patient was repositioned in Trendelenburg position and an additional 12 mm port was placed in the lower midline.  The ureter was then dissected down to the level of the bladder laparoscopically.  A lower midline incision was then made and the kidney was brought out through this incision.  The aforementioned 12 mm port sites had been closed with 0 Vicryl sutures.  The bladder was then mobilized and the space of Retzius developed.  The ureter was then examined and was seen entering into the bladder where it was excised in an extravesicle fashion with a moderately sized bladder cuff.  The bladder hiatus was then closed with two running 2-0 Vicryl sutures in 2 layers.  The bladder was then filled with saline irrigation and the bladder appeared to be watertight without evidence for urine leak.  A #19 Blake drain was then brought through a separate stab incision in the right lower quadrant and positioned in the perivesical space.  The wound was then copiously irrigated and preparations were made for closure.  The lower midline incision was then closed with 2 running #1 PDS sutures and all skin incisions were reapproximated with staples.  Sterile dressings were applied.  The patient appeared to tolerate procedure well without complications.  He was able to be extubated and transferred to the recovery unit in satisfactory condition.     Heloise Purpura, MD     LB/MEDQ  D:  08/02/2010  T:  08/03/2010  Job:  [id number]  Electronically Signed by Heloise Purpura MD on 08/09/2010 09:18:35 PM

## 2010-08-21 NOTE — Discharge Summary (Signed)
[name], Watkins              ACCOUNT NO.:  [id number]  MEDICAL RECORD NO.:  [id number]           PATIENT TYPE:  I  LOCATION:  1442                         [facility]ACILITY:  [facility]  PHYSICIAN:  Heloise Purpura, MD      DATE OF BIRTH:  [birth year]  DATE OF ADMISSION:  07/24/2010 DATE OF DISCHARGE:  07/27/2010                              DISCHARGE SUMMARY   ADMISSION DIAGNOSES: 1. Right renal neoplasm. 2. History of urothelial carcinoma of the bladder.  DISCHARGE DIAGNOSES: 1. Right renal neoplasm. 2. History of urothelial carcinoma of the bladder.  PROCEDURE:  Right laparoscopic nephroureterectomy.  HOSPITAL COURSE:  For full details, please see admission history and physical.  Briefly, [name] is a 55 year old gentleman with a history of urothelial carcinoma of the bladder.  He recently underwent staging evaluation of his upper urinary tract including a CT scan which revealed an enhancing right renal mass concerning for renal malignancy.  He underwent metastatic evaluation which was negative and after reviewing treatment options he elected to proceed with surgical therapy and a right laparoscopic radical nephrectomy.  However, based on his history of urothelial carcinoma, Dr. Laverle Patter did recommend removal of his right ureter as well so to make surveillance for his urothelial carcinoma easier in the future.  After considering this, he did elect to proceed with surgical therapy and a right laparoscopic radical nephroureterectomy.  HOSPITAL COURSE:  On Jul 24, 2010, he was taken to the operating room where he underwent the above named procedures which he tolerated well without complications.  Postoperatively he was able to be transferred to regular hospital room following recovery from anesthesia.  He was able to be transitioned to clear liquids that evening.  He tolerated well and was able to be transitioned to a regular diet over the course of the next 24 to 48 hours.  He was  also able to begin ambulation that evening which he did with some encouragement but over the course of the next 24 to 48 hours did it without difficulty.  Postoperatively he was found to be hemodynamically stable as noted by postoperative hemoglobin of 13.8. He remained hemodynamically stable throughout his hospitalization as noted by hemoglobin prior to discharge of 12.3.  His serum creatinine did slightly increase from 1.20 postoperatively to 1.48 on postoperative day #1, on postoperative day #2 it did subsequently start to decrease to 1.44.  On the afternoon of postoperative day #2, he was found to have excellent urine output with minimal output from his perinephric drain. Therefore fluid was sent from his drain to check for creatinine.  It was found to be consistent with serum at 1.4.  Therefore on postoperative day #3 his perinephric drain was removed.  He was also sent for a cystogram to check for any extravasation or leak from the surgical site. It returned showing no extravasation or leak.  Therefore, he was able to have his indwelling Foley catheter removed.  On the afternoon of postoperative day #3, he was ambulating without difficulty.  His pain was well managed and he was tolerating a regular diet.  He was therefore felt stable for discharge home  as he had met all discharge criteria.  DISPOSITION:  Home.  DISCHARGE INSTRUCTIONS:  He was instructed to be ambulatory but specifically told to refrain from any heavy lifting, strenuous activity or driving.  He was instructed to resume a regular diet.  DISCHARGE MEDICATIONS:  He was instructed to resume home medications consisting of lisinopril and amlodipine.  In addition, he was provided a prescription for Vicodin to take as needed for pain and told to use Colace as a stool softener.  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  FOLLOWUP:  He will  follow up in 10 to 14 days for further postoperative evaluation as well as removal of skin staples.     Delia Chimes, NP   ______________________________ Heloise Purpura, MD    MA/MEDQ  D:  08/10/2010  T:  08/10/2010  Job:  [id number]  Electronically Signed by Delia Chimes NP on 08/21/2010 10:01:35 AM Electronically Signed by Heloise Purpura MD on 08/21/2010 08:07:03 PM

## 2010-12-12 LAB — POCT HEMOGLOBIN-HEMACUE
Hemoglobin: 16.5
Operator id: 114531

## 2011-01-31 ENCOUNTER — Other Ambulatory Visit (HOSPITAL_COMMUNITY): Payer: Self-pay | Admitting: Urology

## 2011-01-31 ENCOUNTER — Ambulatory Visit (HOSPITAL_COMMUNITY)
Admission: RE | Admit: 2011-01-31 | Discharge: 2011-01-31 | Disposition: A | Discharge status: Home / Self Care | Payer: BC Managed Care – PPO | Source: Ambulatory Visit | Attending: Urology | Admitting: Urology

## 2011-01-31 DIAGNOSIS — C649 Malignant neoplasm of unspecified kidney, except renal pelvis: Secondary | ICD-10-CM

## 2011-01-31 DIAGNOSIS — Z85528 Personal history of other malignant neoplasm of kidney: Secondary | ICD-10-CM | POA: Insufficient documentation

## 2011-08-09 ENCOUNTER — Ambulatory Visit (HOSPITAL_COMMUNITY)
Admission: RE | Admit: 2011-08-09 | Discharge: 2011-08-09 | Disposition: A | Discharge status: Home / Self Care | Payer: 59 | Source: Ambulatory Visit | Attending: Urology | Admitting: Urology

## 2011-08-09 ENCOUNTER — Other Ambulatory Visit (HOSPITAL_COMMUNITY): Payer: Self-pay | Admitting: Urology

## 2011-08-09 DIAGNOSIS — C649 Malignant neoplasm of unspecified kidney, except renal pelvis: Secondary | ICD-10-CM

## 2012-02-22 ENCOUNTER — Other Ambulatory Visit (HOSPITAL_COMMUNITY): Payer: Self-pay | Admitting: Urology

## 2012-02-22 ENCOUNTER — Ambulatory Visit (HOSPITAL_COMMUNITY)
Admission: RE | Admit: 2012-02-22 | Discharge: 2012-02-22 | Disposition: A | Discharge status: Home / Self Care | Payer: 59 | Source: Ambulatory Visit | Attending: Urology | Admitting: Urology

## 2012-02-22 DIAGNOSIS — C649 Malignant neoplasm of unspecified kidney, except renal pelvis: Secondary | ICD-10-CM | POA: Insufficient documentation

## 2012-06-18 IMAGING — RF DG CYSTOGRAM 3+V
12 series · 12 of 12 positions shown · non-contrast
Comparison: None.

CLINICAL DATA: Prior right neck for a ureterectomy.

CYSTOGRAM
TECHNIQUE: After catheterization of the urinary bladder following
sterile technique the bladder was filled with 225 cc Cysto-Hypaque
30% by drip infusion.  Serial spot images were obtained during
bladder filling and post draining.
Fluoroscopy Time: 0.8 minutes

[Series 1: run · 1 of 1 slices shown (1 of 12)]
[im 1/1]
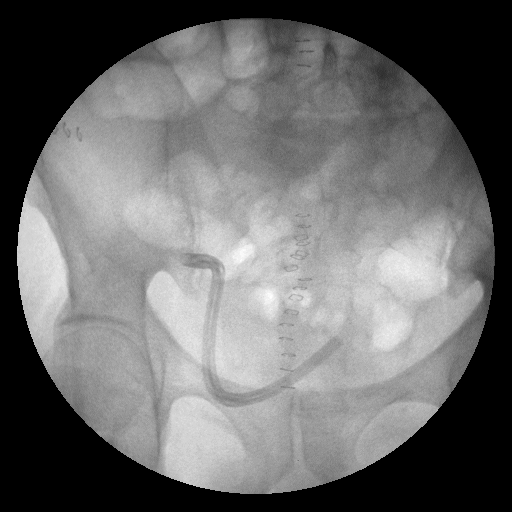

[Series 2: run · 1 of 1 slices shown (2 of 12)]
[im 1/1]
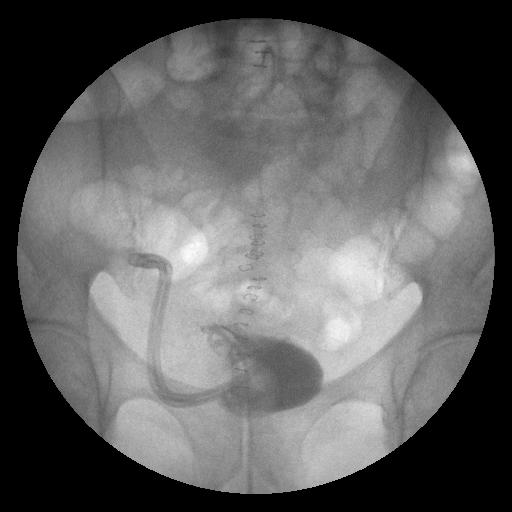

[Series 3: run · 1 of 1 slices shown (3 of 12)]
[im 1/1]
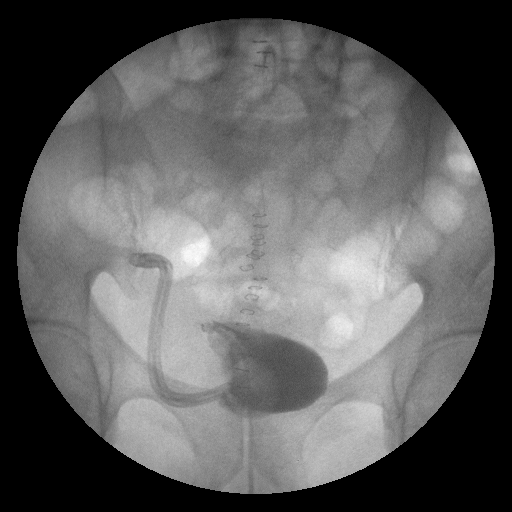

[Series 4: run · 1 of 1 slices shown (4 of 12)]
[im 1/1]
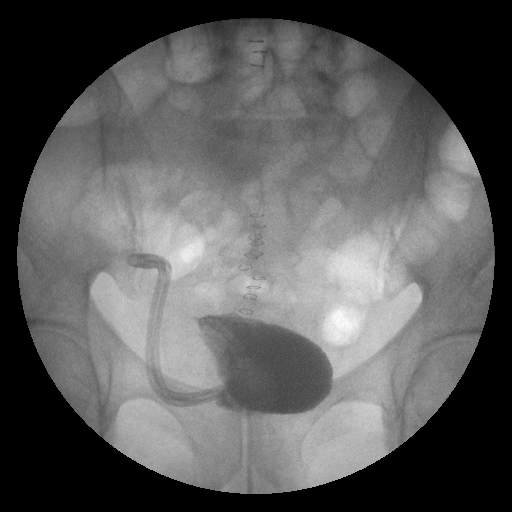

[Series 5: run · 1 of 1 slices shown (5 of 12)]
[im 1/1]
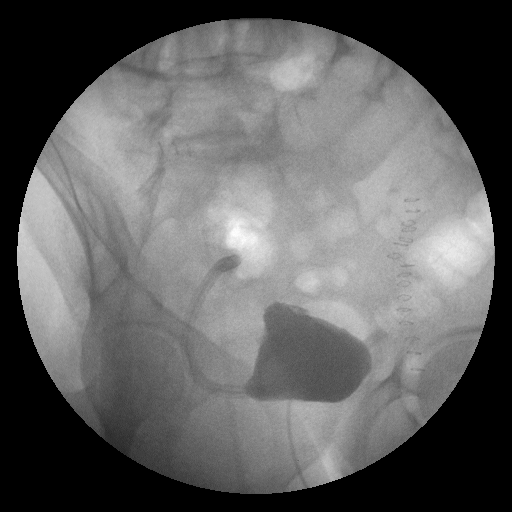

[Series 6: run · 1 of 1 slices shown (6 of 12)]
[im 1/1]
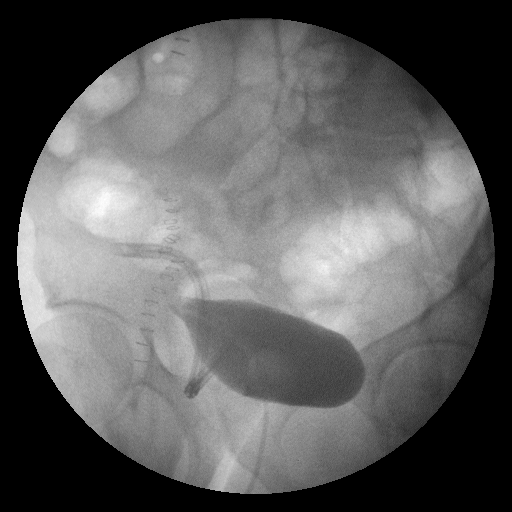

[Series 7: run · 1 of 1 slices shown (7 of 12)]
[im 1/1]
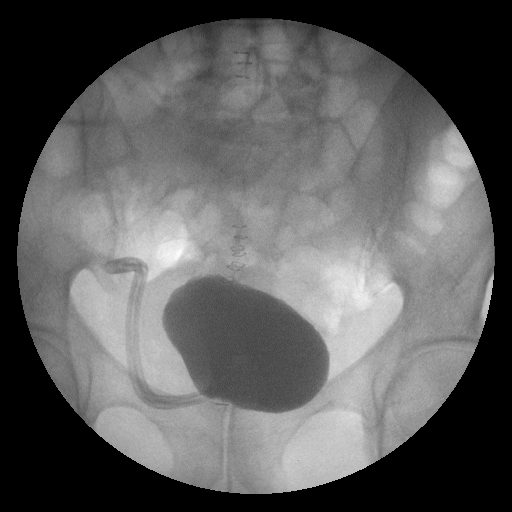

[Series 8: run · 1 of 1 slices shown (8 of 12)]
[im 1/1]
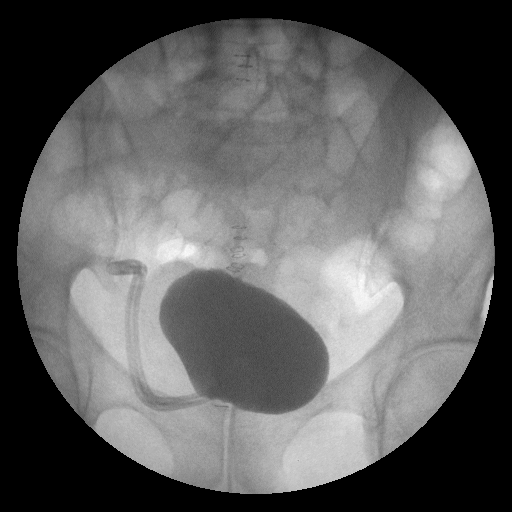

[Series 9: run · 1 of 1 slices shown (9 of 12)]
[im 1/1]
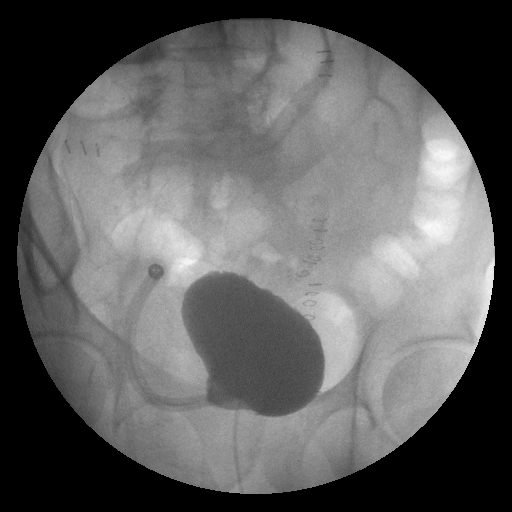

[Series 10: run · 1 of 1 slices shown (10 of 12)]
[im 1/1]
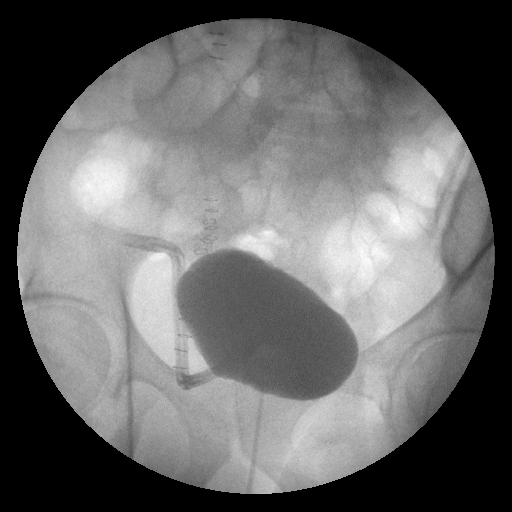

[Series 11: run · 1 of 1 slices shown (11 of 12)]
[im 1/1]
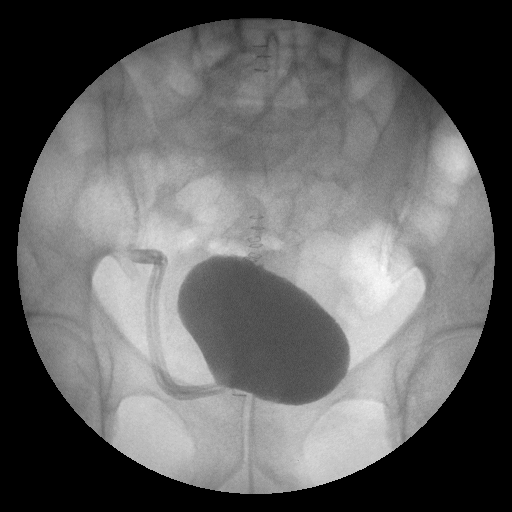

[Series 12: run · 1 of 1 slices shown (12 of 12)]
[im 1/1]
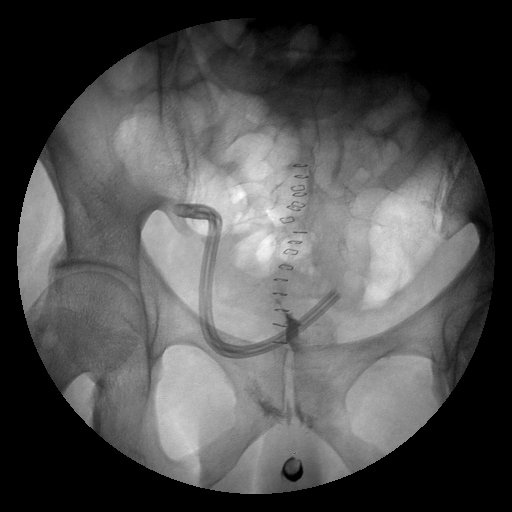

[12 of 12 positions shown; findings below may reference images not displayed]

FINDINGS: Scout film of the abdomen shows mild gaseous distention
of the colon, possibly mild ileus.  The skin staples are noted in
the midline of the lower pelvis.  Surgical drain in place.

The patient's bladder was filled with contrast through the Foley
catheter.  Shows a small capacity bladder.  There is an impression
on the right side of the bladder, likely related to surgery in this
area.  I see no evidence of leak.
IMPRESSION: Impression on the right side of the bladder, likely related to
recent surgery in the region.  No evidence of bladder leak.

## 2012-08-26 ENCOUNTER — Other Ambulatory Visit (HOSPITAL_COMMUNITY): Payer: Self-pay | Admitting: Urology

## 2012-08-26 ENCOUNTER — Ambulatory Visit (HOSPITAL_COMMUNITY)
Admission: RE | Admit: 2012-08-26 | Discharge: 2012-08-26 | Disposition: A | Discharge status: Home / Self Care | Payer: 59 | Source: Ambulatory Visit | Attending: Urology | Admitting: Urology

## 2012-08-26 DIAGNOSIS — C649 Malignant neoplasm of unspecified kidney, except renal pelvis: Secondary | ICD-10-CM

## 2012-09-02 ENCOUNTER — Other Ambulatory Visit (HOSPITAL_COMMUNITY): Payer: Self-pay | Admitting: Urology

## 2012-09-02 ENCOUNTER — Ambulatory Visit (HOSPITAL_COMMUNITY)
Admission: RE | Admit: 2012-09-02 | Discharge: 2012-09-02 | Disposition: A | Discharge status: Home / Self Care | Payer: 59 | Source: Ambulatory Visit | Attending: Urology | Admitting: Urology

## 2012-09-02 DIAGNOSIS — C649 Malignant neoplasm of unspecified kidney, except renal pelvis: Secondary | ICD-10-CM | POA: Insufficient documentation

## 2012-09-02 DIAGNOSIS — R918 Other nonspecific abnormal finding of lung field: Secondary | ICD-10-CM | POA: Insufficient documentation

## 2012-09-17 ENCOUNTER — Other Ambulatory Visit: Payer: Self-pay | Admitting: Urology

## 2012-09-23 ENCOUNTER — Encounter (HOSPITAL_COMMUNITY): Payer: Self-pay | Admitting: Pharmacy Technician

## 2012-09-24 ENCOUNTER — Other Ambulatory Visit (HOSPITAL_COMMUNITY): Payer: Self-pay | Admitting: Urology

## 2012-09-24 NOTE — Progress Notes (Signed)
Chest 2 view xray 09-02-12 epic

## 2012-09-24 NOTE — Patient Instructions (Addendum)
20 BEVERLEY SHERRARD  09/24/2012   Your procedure is scheduled on: 09-29-2012  Report to [facility] at 230 PM  Call this number if you have problems the morning of surgery [phone]   Remember:   Do not eat food  :After Midnight.  Clear liquids midnight until 1100 am day of surgery, then nothing by mouth   Take these medicines the morning of surgery with A SIP OF WATER: Norvasc (amlodipine)                                SEE [place] PREPARING FOR SURGERY SHEET   Do not wear jewelry, make-up or nail polish.  Do not wear lotions, powders, or perfumes. You may wear deodorant.   Men may shave face and neck.  Do not bring valuables to the hospital. [facility] IS NOT RESPONSIBLE FOR VALUEABLES.  Contacts, dentures or bridgework may not be worn into surgery.  Leave suitcase in the car. After surgery it may be brought to your room.  For patients admitted to the hospital, checkout time is 11:00 AM the day of discharge.   Patients discharged the day of surgery will not be allowed to drive home.  Name and phone number of your driver:  Special Instructions: N/A   Please read over the following fact sheets that you were given: MRSA Information.  Call Merleen Nicely RN pre op nurse if needed [phone]    FAILURE TO FOLLOW THESE INSTRUCTIONS MAY RESULT IN THE CANCELLATION OF YOUR SURGERY.  PATIENT SIGNATURE___________________________________________  NURSE SIGNATURE_____________________________________________

## 2012-09-25 ENCOUNTER — Encounter (HOSPITAL_COMMUNITY): Payer: Self-pay

## 2012-09-25 ENCOUNTER — Encounter (HOSPITAL_COMMUNITY)
Admission: RE | Admit: 2012-09-25 | Discharge: 2012-09-25 | Disposition: A | Discharge status: Home / Self Care | Payer: 59 | Source: Ambulatory Visit | Attending: Urology | Admitting: Urology

## 2012-09-25 ENCOUNTER — Other Ambulatory Visit (HOSPITAL_COMMUNITY): Payer: Self-pay | Admitting: *Deleted

## 2012-09-25 HISTORY — DX: Essential (primary) hypertension: I10

## 2012-09-25 LAB — CBC
HCT: 44.4 % (ref 39.0–52.0)
Hemoglobin: 15.3 g/dL (ref 13.0–17.0)
MCH: 31.5 pg (ref 26.0–34.0)
MCHC: 34.5 g/dL (ref 30.0–36.0)
MCV: 91.5 fL (ref 78.0–100.0)
RBC: 4.85 MIL/uL (ref 4.22–5.81)

## 2012-09-25 LAB — BASIC METABOLIC PANEL: GFR calc Af Amer: 71 mL/min — ABNORMAL LOW (ref >90)

## 2012-09-25 NOTE — Progress Notes (Signed)
09/25/12 1143  OBSTRUCTIVE SLEEP APNEA  Have you ever been diagnosed with sleep apnea through a sleep study? No  Do you snore loudly (loud enough to be heard through closed doors)?  1  Do you often feel tired, fatigued, or sleepy during the daytime? 0  Has anyone observed you stop breathing during your sleep? 0  Do you have, or are you being treated for high blood pressure? 1  BMI more than 35 kg/m2? 0  Age over 58 years old? 1  Neck circumference greater than 40 cm/18 inches? 0  Gender: 1  Obstructive Sleep Apnea Score 4  Score 4 or greater  Results sent to PCP

## 2012-09-26 NOTE — H&P (Signed)
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  He has undergone transurethral resections in October 2004, June 2006, March of 2008 and most recently April 2009. He does smoke. He completed trial (SPI-612). His right ureter was removed during his nephrectomy in May 2012 performed for renal cell carcinoma.  Last upper tract evaluation: Apr 2012, CT negative for urothelial cancer Last cysto: Nov 2013 - negative  2) Renal cell carcinoma: He is s/p a right nephroureterectomy on 07/24/10 for a 6.3 cm renal tumor that was incidentally noted during upper tract surveillance for urothelial carcinoma.  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  He did undergo a prostate biopsy in March 2008 which was negative for prostate cancer. Last PSA: 0.76 (Nov 13)  Interval history:  Grant Watkins follows up today for surveillance of his bladder cancer and kidney cancer.  He is now 2 years status post his right nephrectomy.  He denies any recent hematuria, weight loss, night sweats, or new pain symptoms.  He also is scheduled for cystoscopic surveillance of his bladder cancer today.   Past Medical History  Problems  1. History of  Bladder Cancer V10.51 2. History of  Hypertension 401.9  Renal Cell Carcinoma 189.0    Surgical History Problems  1. History of  Cystoscopy Bladder Tumor 596.9 2. History of  Cystoscopy With Fulguration Small Lesion (5-67mm) 3. History of  Kidney Surgery Laparoscopically Assisted Nephroureterectomy 4. History of  Ureterectomy  Current Meds 1. AmLODIPine Besylate 5 MG Oral Tablet; Therapy: 16Jan2012 to 2. Lisinopril-Hydrochlorothiazide 20-12.5 MG Oral  Tablet; Therapy: 16Jan2012 to 3. Mupirocin 2 % External Ointment; Therapy: 02May2012 to  Allergies Medication  1. No Known Drug Allergies  Social History Problems  1. Alcohol Use 1 2. Marital History - Currently Married 3. Occupation: printing 4. Tobacco Use V15.82 3/4 ppd for 30 years  Review of Systems  Genitourinary: no hematuria.  Constitutional: no fever, no night sweats and no recent weight loss.  Cardiovascular: no leg swelling.  Respiratory: no shortness of breath.    Vitals Vital Signs [Data Includes: Last 1 Day]  17Jun2014 04:09PM  Blood Pressure: 165 / 99 Temperature: 98.6 F Heart Rate: 99  Physical Exam Constitutional: Well nourished and well developed . No acute distress.  ENT:. The ears and nose are normal in appearance.  Neck: The appearance of the neck is normal and no neck mass is present.  Pulmonary: No respiratory distress, normal respiratory rhythm and effort and clear bilateral breath sounds.  Cardiovascular: Heart rate and rhythm are normal . No peripheral edema.  Abdomen: Incision site(s) well healed. The abdomen is soft and nontender. No masses are palpated. No CVA tenderness. No hernias are palpable. No hepatosplenomegaly noted.  Lymphatics: The supraclavicular, femoral and inguinal nodes are not enlarged or tender.  Skin: Normal skin turgor, no visible rash and no visible skin lesions.  Neuro/Psych:. Mood and affect are appropriate.    Results/Data Urine [Data Includes: Last 1 Day]   17Jun2014  COLOR YELLOW   APPEARANCE CLEAR   SPECIFIC GRAVITY 1.025   pH 6.0   GLUCOSE 250 mg/dL  BILIRUBIN NEG   KETONE NEG mg/dL  BLOOD NEG   PROTEIN 100 mg/dL  UROBILINOGEN 0.2 mg/dL  NITRITE NEG   LEUKOCYTE ESTERASE NEG   SQUAMOUS EPITHELIAL/HPF MODERATE   WBC 0-2 WBC/hpf  RBC 0-2 RBC/hpf  BACTERIA NONE SEEN   CRYSTALS NONE SEEN   CASTS NONE SEEN     I independently reviewed his chest x-ray.  This does reveal a questionable nodule at the left  base.  This is felt to likely represent a nipple shadow.  There are no other concerning features or findings.  I also and eventually reviewed his CT scan of the abdomen and pelvis with contrast.  This reveals no evidence that would suggest recurrence or metastatic disease of either his kidney cancer or bladder cancer.  Procedure  Procedure: Cystoscopy  Chaperone Present: Janna Arch.  Indication: History of Urothelial Carcinoma.  Informed Consent: Risks, benefits, and potential adverse events were discussed and informed consent was obtained from the patient.  Prep: The patient was prepped with betadine.  Anesthesia:. Local anesthesia was administered intraurethrally with 2% lidocaine jelly.  Antibiotic prophylaxis: Ciprofloxacin.  Procedure Note:  Urethral meatus:. No abnormalities.  Anterior urethra: No abnormalities.  Prostatic urethra: No abnormalities.  Bladder: Visulization was clear. The right ureteral orifice was absent. The left ureteral orifice was in the normal anatomic position and had clear efflux of urine. A systematic survey of the bladder demonstrated no bladder tumors or stones. The mucosa was smooth without abnormalities. A saline bladder washing was obtained and sent for cytologic analysis. The patient tolerated the procedure well.  Complications: None.    Assessment  Bladder Cancer 188.9  Renal Cell Carcinoma 189.0    Plan  Bladder Cancer (188.9)  1. Cysto  Requested for: 17Jun2014 2. URINE CYTOLOGY W/REFLEX FISH  Done: 17Jun2014 Health Maintenance (V70.0)  3. UA With REFLEX  Done: 17Jun2014 03:59PM Prostate Hard Area Or Nodule (600.10)  4. PSA REFLEX TO FREE  Requested for: 17Jun2014 Renal Cell Carcinoma (189.0)  5. CHEST X-RAY  Requested for: 17Jun2014 6. CHEST X-RAY  Requested for: 17Jun2014 7. COMPREHENSIVE METABOLIC PANEL  Requested for: 17Jun2014 8. CT-ABD/PELVIS WITH CONTRAST  Requested for: 17Jun2014 9. Follow-up Year x 1 Office  Follow-up  Requested for:  17Jun2014  Bladder Cancer 188.9  Renal Cell Carcinoma 189.0    Discussion/Summary  1.  Renal cell carcinoma: There is no clear evidence to suggest recurrent or metastatic disease.  However, I would like to repeat his chest x-ray today with nipple markers to ensure that the questionable area at the left base is indeed a nipple shadow and not a pulmonary nodule.  Assuming that this is negative, he will then follow up in 1 year with repeat chest and abdominal imaging as well as laboratory studies.  2.  History of prostate nodule: His PSA and rectal exam will be checked in 1 year.  3.  Bladder cancer: No evidence of cystoscopic recurrence.  A bladder wash has been obtained for cytology and reflex FISH.  We will tentatively follow up in 1 year for continued cystoscopic surveillance.  CC: Dr. Catha Gosselin     Verified Results URINE CYTOLOGY W/REFLEX FISH1 17Jun2014 [id number] Lyda Perone  [Sep 10, 2012 6:25PM Benen Weida] Pt informed of cytology and FISH results. Will proceed with cystoscopy, bladder biopsy, possible prostatic urethral biopsy, and left retrograde pyelography.   Test Name Result Flag Reference  FINAL DIAGNOSIS:1  A1   - ABNORMAL FINDINGS. - ATYPICAL UROTHELIAL CELLS ARE PRESENT. - RULE OUT UROTHELIAL NEOPLASIA.  1 Container Submitted  SOURCE:1  Urine: Voided1    60CC OF CLEAR URINE CUP1, 70CC OF CLEAR URINE CUP2 1 SLIDE PREPARED [id number] LL  Relevant Clinical Info1 BLADDER CANCER1    PATHOLOGIST:1     REVIEWED BY VALERIE J. FIELDS, MD, FCAP (ELECTRONIC SIGNATURE ON FILE)   FISH(UroVysion)1 17Jun2014 04:34PM1 Lyda Perone  SPECIMEN TYPE: OTHER  [Sep 10, 2012 6:25PM Medardo Hassing] Pt informed of cytology and FISH results. Will proceed with cystoscopy, bladder biopsy, possible prostatic urethral biopsy, and left retrograde pyelography.   Test Name Result Flag Reference  FISH, UroVysion (R), Bladder Cancer1 REPORT1    CYTOGENETIC RESULTS CYTOGENETIC  REFERENCE #: [id number] TEST SETUP DATE: 09/05/2012 TEST COMPLETION DATE: 09/08/2012 SPECIMEN SOURCE: URINE CLINICAL HISTORY:NOT PROVIDED INTERPHASE CELLS: >=25      METAPHASE CELLS: 0 FISH RESULTS: POSITIVE INTERPRETATION AND COMMENTS:     THIS RESULT IS INDICATIVE OF BLADDER CANCER ACCORDING TO THE VYSIS UROVYSION  DIRECTIONAL INSERT:  13 CELLS EXHIBITING POLYSOMY FORCHROMOSOMES 3, 7, 9, AND 17.     POSITIVE UROVYSION  RESULTS IN THE ABSENCE OF OTHER SIGNS OR SYMPTOMS OF BLADDER CANCER MAY BE EVIDENCE OF OTHER URINARY TRACT RELATED CANCERS, FURTHER PATIENT FOLLOW-UP IS JUSTIFIED. IF THE TEST RESULTS ARE NOT CONSISTENT WITH THE CLINICAL FINDINGS, A CONSULTATION BETWEEN A PATHOLOGIST AND THE TREATING PHYSICIAN IS WARRANTED.     VYSIS UROVYSION  TEST: DESIGNED AS AN AID FOR THE INITIAL DIAGNOSIS OF BLADDER CARCINOMA IN PATIENTS WITH HEMATURIA AND SUBSEQUENT MONITORING FOR TUMOR RECURRENCE IN PATIENTS PREVIOUSLY DIAGNOSED WITH BLADDER CANCER.  FOR SAMPLES OTHER THAN VOIDED URINE, THE PERFORMANCE CHARACTERISTICS OF THIS TEST HAVE BEEN DETERMINED BY [facility]. PERFORMANCE CHARACTERISTICS REFER TO THE ANALYTICAL PERFORMANCE OF THE TEST. FOR A REVIEW OF THE PERFORMANCE CHARACTERISTICS OF THE UROVYSION ASSAY, SEE ADV ANAT PATHOL 2008;15:279-86.     FISH ISCN:  NUC ISH(D3Z1X3-4,D7Z1X4-6,CDKN2AX0-4,D17Z1X1-4)[13] ELECTRONIC SIGNATURE ON FILE ____________________________ Providence Crosby, PH.D., [facility], [phone]  Results Recieved1 06/23/141    REFERENCE LAB ACCESSION: [id number] EC     1. Amended By: Heloise Purpura; 09/10/2012 6:25 PMEST  Signatures Electronically signed by : Heloise Purpura, M.D.; Sep 10 2012  6:25PM

## 2012-09-29 ENCOUNTER — Encounter (HOSPITAL_COMMUNITY)
Admission: RE | Disposition: A | Discharge status: Home / Self Care | Payer: Self-pay | Source: Ambulatory Visit | Attending: Urology

## 2012-09-29 ENCOUNTER — Encounter (HOSPITAL_COMMUNITY): Payer: Self-pay | Admitting: *Deleted

## 2012-09-29 ENCOUNTER — Ambulatory Visit (HOSPITAL_COMMUNITY)
Admission: RE | Admit: 2012-09-29 | Discharge: 2012-09-29 | Disposition: A | Discharge status: Home / Self Care | Payer: 59 | Source: Ambulatory Visit | Attending: Urology | Admitting: Urology

## 2012-09-29 ENCOUNTER — Ambulatory Visit (HOSPITAL_COMMUNITY): Payer: 59 | Admitting: Anesthesiology

## 2012-09-29 ENCOUNTER — Encounter (HOSPITAL_COMMUNITY): Payer: Self-pay | Admitting: Anesthesiology

## 2012-09-29 DIAGNOSIS — N402 Nodular prostate without lower urinary tract symptoms: Secondary | ICD-10-CM | POA: Insufficient documentation

## 2012-09-29 DIAGNOSIS — C679 Malignant neoplasm of bladder, unspecified: Secondary | ICD-10-CM | POA: Insufficient documentation

## 2012-09-29 DIAGNOSIS — C649 Malignant neoplasm of unspecified kidney, except renal pelvis: Secondary | ICD-10-CM | POA: Insufficient documentation

## 2012-09-29 DIAGNOSIS — Z905 Acquired absence of kidney: Secondary | ICD-10-CM | POA: Insufficient documentation

## 2012-09-29 DIAGNOSIS — I10 Essential (primary) hypertension: Secondary | ICD-10-CM | POA: Insufficient documentation

## 2012-09-29 HISTORY — PX: CYSTOSCOPY W/ RETROGRADES: SHX1426

## 2012-09-29 HISTORY — PX: CYSTOSCOPY WITH BIOPSY: SHX5122

## 2012-09-29 LAB — SURGICAL PCR SCREEN: MRSA, PCR: NEGATIVE

## 2012-09-29 SURGERY — CYSTOSCOPY, WITH BIOPSY
Anesthesia: General | Site: Ureter | Wound class: Clean Contaminated

## 2012-09-29 MED ORDER — CIPROFLOXACIN IN D5W 400 MG/200ML IV SOLN
INTRAVENOUS | Status: AC
  Filled 2012-09-29: qty 200

## 2012-09-29 MED ORDER — MIDAZOLAM HCL 5 MG/5ML IJ SOLN
INTRAMUSCULAR | Status: DC | PRN
  Administered 2012-09-29: 2 mg via INTRAVENOUS

## 2012-09-29 MED ORDER — HYDROCODONE-ACETAMINOPHEN 5-325 MG PO TABS: 1.0000 | ORAL_TABLET | Freq: Four times a day (QID) | ORAL | Status: DC | PRN

## 2012-09-29 MED ORDER — STERILE WATER FOR IRRIGATION IR SOLN
Status: DC | PRN
  Administered 2012-09-29: 3000 mL

## 2012-09-29 MED ORDER — LACTATED RINGERS IV SOLN
INTRAVENOUS | Status: DC | PRN
  Administered 2012-09-29: 15:00:00 via INTRAVENOUS

## 2012-09-29 MED ORDER — CIPROFLOXACIN IN D5W 400 MG/200ML IV SOLN
400.0000 mg | INTRAVENOUS | Status: AC
  Administered 2012-09-29: 400 mg via INTRAVENOUS

## 2012-09-29 MED ORDER — LIDOCAINE HCL (CARDIAC) 20 MG/ML IV SOLN
INTRAVENOUS | Status: DC | PRN
  Administered 2012-09-29: 80 mg via INTRAVENOUS

## 2012-09-29 MED ORDER — FENTANYL CITRATE 0.05 MG/ML IJ SOLN
INTRAMUSCULAR | Status: DC | PRN
  Administered 2012-09-29 (×5): 50 ug via INTRAVENOUS

## 2012-09-29 MED ORDER — 0.9 % SODIUM CHLORIDE (POUR BTL) OPTIME
TOPICAL | Status: DC | PRN
  Administered 2012-09-29: 1000 mL

## 2012-09-29 MED ORDER — ONDANSETRON HCL 4 MG/2ML IJ SOLN
INTRAMUSCULAR | Status: DC | PRN
  Administered 2012-09-29: 4 mg via INTRAVENOUS

## 2012-09-29 MED ORDER — IOHEXOL 300 MG/ML  SOLN
INTRAMUSCULAR | Status: AC
  Filled 2012-09-29: qty 1

## 2012-09-29 MED ORDER — IOHEXOL 300 MG/ML  SOLN
INTRAMUSCULAR | Status: DC | PRN
  Administered 2012-09-29: 5 mL

## 2012-09-29 MED ORDER — PROPOFOL 10 MG/ML IV BOLUS
INTRAVENOUS | Status: DC | PRN
  Administered 2012-09-29: 180 mg via INTRAVENOUS

## 2012-09-29 MED ORDER — MUPIROCIN 2 % EX OINT
TOPICAL_OINTMENT | Freq: Two times a day (BID) | CUTANEOUS | Status: DC
  Administered 2012-09-29: 1 via NASAL
  Filled 2012-09-29: qty 22

## 2012-09-29 MED ORDER — PHENAZOPYRIDINE HCL 100 MG PO TABS: 100.0000 mg | ORAL_TABLET | Freq: Three times a day (TID) | ORAL | Status: DC | PRN

## 2012-09-29 SURGICAL SUPPLY — 21 items
ADAPTER CATH URET PLST 4-6FR (CATHETERS) ×3 IMPLANT
BAG URO CATCHER STRL LF (DRAPE) ×3 IMPLANT
CATH INTERMIT  6FR 70CM (CATHETERS) ×3 IMPLANT
CATH ROBINSON RED A/P 16FR (CATHETERS) IMPLANT
CLOTH BEACON ORANGE TIMEOUT ST (SAFETY) ×3 IMPLANT
DRAPE CAMERA CLOSED 9X96 (DRAPES) ×3 IMPLANT
ELECT REM PT RETURN 9FT ADLT (ELECTROSURGICAL) ×3
ELECTRODE REM PT RTRN 9FT ADLT (ELECTROSURGICAL) ×2 IMPLANT
GLOVE BIOGEL M STRL SZ7.5 (GLOVE) ×3 IMPLANT
GOWN PREVENTION PLUS XXLARGE (GOWN DISPOSABLE) ×3 IMPLANT
GOWN STRL NON-REIN LRG LVL3 (GOWN DISPOSABLE) IMPLANT
GOWN STRL REIN XL XLG (GOWN DISPOSABLE) ×6 IMPLANT
GUIDEWIRE STR DUAL SENSOR (WIRE) ×3 IMPLANT
MANIFOLD NEPTUNE II (INSTRUMENTS) ×3 IMPLANT
NDL SAFETY ECLIPSE 18X1.5 (NEEDLE) ×2 IMPLANT
NEEDLE HYPO 18GX1.5 SHARP (NEEDLE) ×1
NEEDLE HYPO 22GX1.5 SAFETY (NEEDLE) IMPLANT
NS IRRIG 1000ML POUR BTL (IV SOLUTION) ×3 IMPLANT
PACK CYSTO (CUSTOM PROCEDURE TRAY) ×3 IMPLANT
TUBING CONNECTING 10 (TUBING) ×3 IMPLANT
WATER STERILE IRR 3000ML UROMA (IV SOLUTION) ×3 IMPLANT

## 2012-09-29 NOTE — Anesthesia Postprocedure Evaluation (Signed)
Anesthesia Post Note  Patient: Grant Watkins  Procedure(s) Performed: Procedure(s) (LRB): CYSTOSCOPY WITH BIOPSY (N/A) CYSTOSCOPY WITH LEFT  RETROGRADE PYELOGRAM (Left)  Anesthesia type: General  Patient location: PACU  Post pain: Pain level controlled  Post assessment: Post-op Vital signs reviewed  Last Vitals: BP 146/98  Pulse 77  Temp(Src) 36.1 C (Oral)  Resp 16  Ht 5\' 10"  (1.778 m)  Wt 177 lb 2 oz (80.343 kg)  BMI 25.41 kg/m2  SpO2 97%  Post vital signs: Reviewed  Level of consciousness: sedated  Complications: No apparent anesthesia complications

## 2012-09-29 NOTE — Op Note (Signed)
Preoperative diagnosis: History of urothelial carcinoma, positive FISH analysis  Postoperative diagnosis: History of urothelial carcinoma, positive FISH analysis  Procedures: 1.  Cystoscopy 2.  Left retrograde pyelography 3.  Biopsies of bladder and prostatic urethra  Surgeon: Dr. Rolly Salter, Montez Hageman.  Anesthesia: General  Complications: None  EBL: Minimal  Intraoperative findings: Left retrograde pyelography demonstrated a normal caliber left ureter without filling defects.  There is no hydronephrosis and there was no filling defects noted within the left renal collecting system.  Specimens: 1.  Left bladder biopsy 2.  Posterior bladder biopsy 3.  Right bladder biopsy 4.  Biopsy of bladder trigone 5.  Prostatic urethral biopsy  Disposition of specimens: Pathology  Indication: [name] is a 57 year old gentleman with a history of urothelial carcinoma of the bladder.  He also has a history of renal cell carcinoma status post a right nephroureterectomy in the past.  He was recently found to have an atypical cytology and positive FISH analysis on routine follow-up.  He presents today for further evaluation to exclude the possibility of recurrent urothelial carcinoma.  The potential risks, complications, and alternative options associated with the above procedures were discussed in detail and informed consent was obtained.  Description of procedure: The patient was taken to the operating room and a general anesthetic was administered.  He was given preoperative antibiotics, placed in the dorsal lithotomy position, and prepped and draped in the usual sterile fashion.  Next a preoperative timeout was performed.  Cystourethroscopy was then performed which revealed a normal anterior and posterior urethra.  A systematic survey of the bladder revealed an absent right ureteral orifice consistent with his history of a nephroureterectomy on that side.  The left ureteral orifice was in its  expected anatomic position and was effluxing clear urine.  There was no evidence of any bladder tumors, stones, or other mucosal pathology on examination of the mucosa of the bladder.  A 6 French open-ended ureteral catheter was inserted into the left ureteral orifice.  Omnipaque contrast was injected with findings as dictated as above.  Attention then returned to the bladder.  A cold cup biopsy forceps was used to obtain biopsies from the left bladder wall, posterior bladder wall, right bladder wall, and the right hemitrigone.  An additional biopsy was obtained from the posterior prostatic urethra.  These sites were then fulgurated with a Bugbee electrode.  The bladder was emptied and reinspected and there appeared to be excellent hemostasis.  The procedure was ended.  The patient was transferred to the recovery unit in satisfactory condition.  There were no complications.

## 2012-09-29 NOTE — Interval H&P Note (Signed)
History and Physical Interval Note:  09/29/2012 3:55 PM  Grant Watkins  has presented today for surgery, with the diagnosis of History of Urothelial Carcinoma, Positive Urine Tumor Markers  The various methods of treatment have been discussed with the patient and family. After consideration of risks, benefits and other options for treatment, the patient has consented to  Procedure(s) with comments: CYSTOSCOPY WITH BIOPSY (N/A) - BLADDER BIOPSIES   CYSTOSCOPY WITH RETROGRADE PYELOGRAM (Left) as a surgical intervention .  The patient's history has been reviewed, patient examined, no change in status, stable for surgery.  I have reviewed the patient's chart and labs.  Questions were answered to the patient's satisfaction.     Camisha Srey,LES

## 2012-09-29 NOTE — Anesthesia Preprocedure Evaluation (Signed)
Anesthesia Evaluation  Patient identified by MRN, date of birth, ID band Patient awake    Reviewed: Allergy & Precautions, H&P , NPO status , Patient's Chart, lab work & pertinent test results  Airway       Dental  (+) Dental Advisory Given   Pulmonary Current Smoker,          Cardiovascular hypertension, Pt. on medications     Neuro/Psych negative neurological ROS  negative psych ROS   GI/Hepatic negative GI ROS, Neg liver ROS,   Endo/Other  negative endocrine ROS  Renal/GU negative Renal ROS     Musculoskeletal negative musculoskeletal ROS (+)   Abdominal   Peds  Hematology negative hematology ROS (+)   Anesthesia Other Findings   Reproductive/Obstetrics                           Anesthesia Physical Anesthesia Plan  ASA: II  Anesthesia Plan: General   Post-op Pain Management:    Induction: Intravenous  Airway Management Planned: LMA  Additional Equipment:   Intra-op Plan:   Post-operative Plan: Extubation in OR  Informed Consent: I have reviewed the patients History and Physical, chart, labs and discussed the procedure including the risks, benefits and alternatives for the proposed anesthesia with the patient or authorized representative who has indicated his/her understanding and acceptance.   Dental advisory given  Plan Discussed with: CRNA  Anesthesia Plan Comments:         Anesthesia Quick Evaluation

## 2012-09-29 NOTE — Transfer of Care (Signed)
Immediate Anesthesia Transfer of Care Note  Patient: Grant Watkins  Procedure(s) Performed: Procedure(s) with comments: CYSTOSCOPY WITH BIOPSY (N/A) - BLADDER BIOPSIES   CYSTOSCOPY WITH LEFT  RETROGRADE PYELOGRAM (Left)  Patient Location: PACU  Anesthesia Type:General  Level of Consciousness: awake, alert  and oriented  Airway & Oxygen Therapy: Patient Spontanous Breathing and Patient connected to face mask oxygen  Post-op Assessment: Report given to PACU RN and Post -op Vital signs reviewed and stable  Post vital signs: Reviewed and stable  Complications: No apparent anesthesia complications

## 2012-09-29 NOTE — Interval H&P Note (Signed)
History and Physical Interval Note:  09/29/2012 4:10 PM  Grant Watkins  has presented today for surgery, with the diagnosis of History of Urothelial Carcinoma, Positive Urine Tumor Markers  The various methods of treatment have been discussed with the patient and family. After consideration of risks, benefits and other options for treatment, the patient has consented to  Procedure(s) with comments: CYSTOSCOPY WITH BIOPSY (N/A) - BLADDER BIOPSIES   CYSTOSCOPY WITH RETROGRADE PYELOGRAM (Left) as a surgical intervention .  The patient's history has been reviewed, patient examined, no change in status, stable for surgery.  I have reviewed the patient's chart and labs.  Questions were answered to the patient's satisfaction.     Saamir Armstrong,LES

## 2012-09-30 ENCOUNTER — Encounter (HOSPITAL_COMMUNITY): Payer: Self-pay | Admitting: Urology

## 2013-12-25 ENCOUNTER — Other Ambulatory Visit (HOSPITAL_COMMUNITY): Payer: Self-pay | Admitting: Urology

## 2013-12-25 ENCOUNTER — Ambulatory Visit (HOSPITAL_COMMUNITY)
Admission: RE | Admit: 2013-12-25 | Discharge: 2013-12-25 | Disposition: A | Discharge status: Home / Self Care | Payer: BC Managed Care – PPO | Source: Ambulatory Visit | Attending: Urology | Admitting: Urology

## 2013-12-25 DIAGNOSIS — C649 Malignant neoplasm of unspecified kidney, except renal pelvis: Secondary | ICD-10-CM

## 2014-07-19 IMAGING — CR DG CHEST 2V
2 series · 2 of 2 positions shown · non-contrast
Comparison: February 22, 2012

CLINICAL DATA: Renal carcinoma  impression:

CHEST - 2 VIEW

[w chest pa]
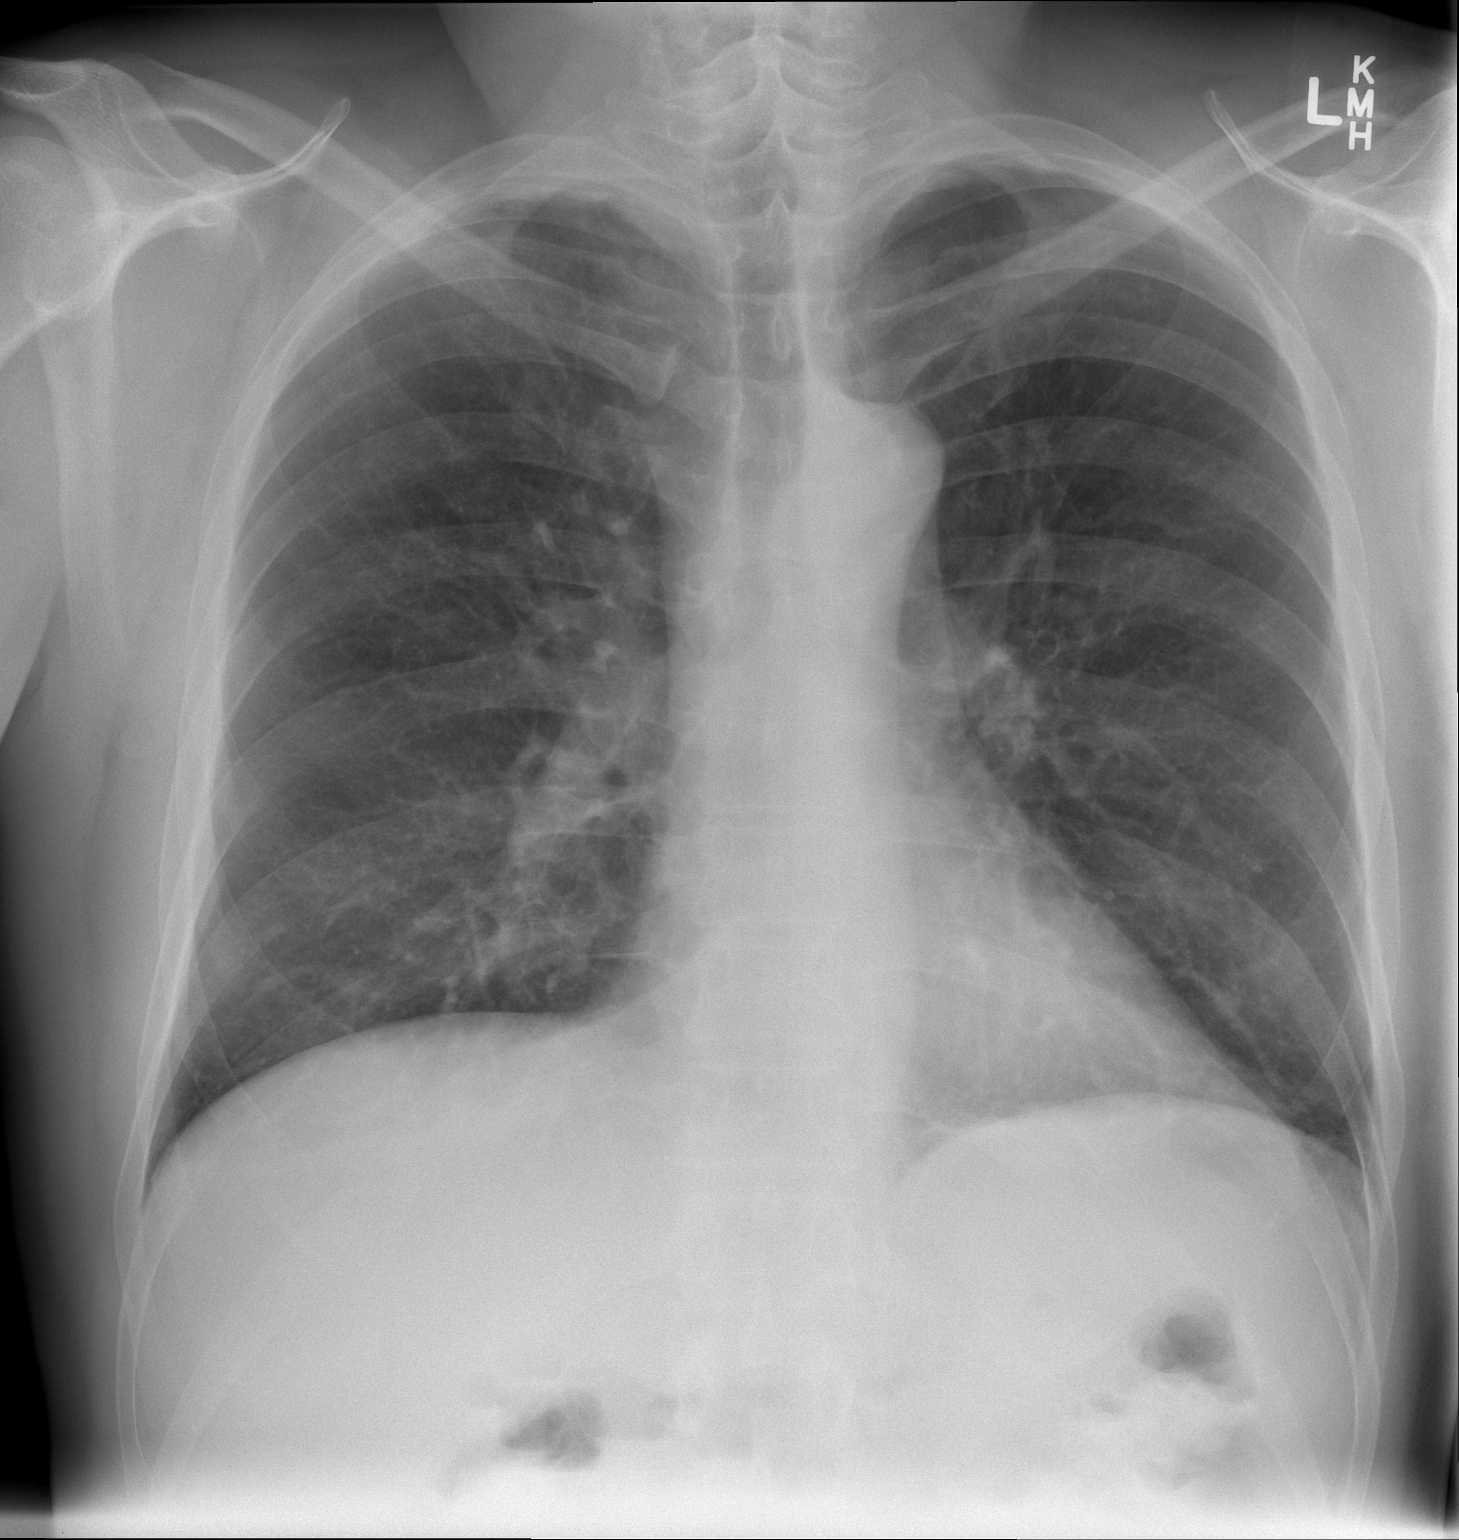

[w chest lat]
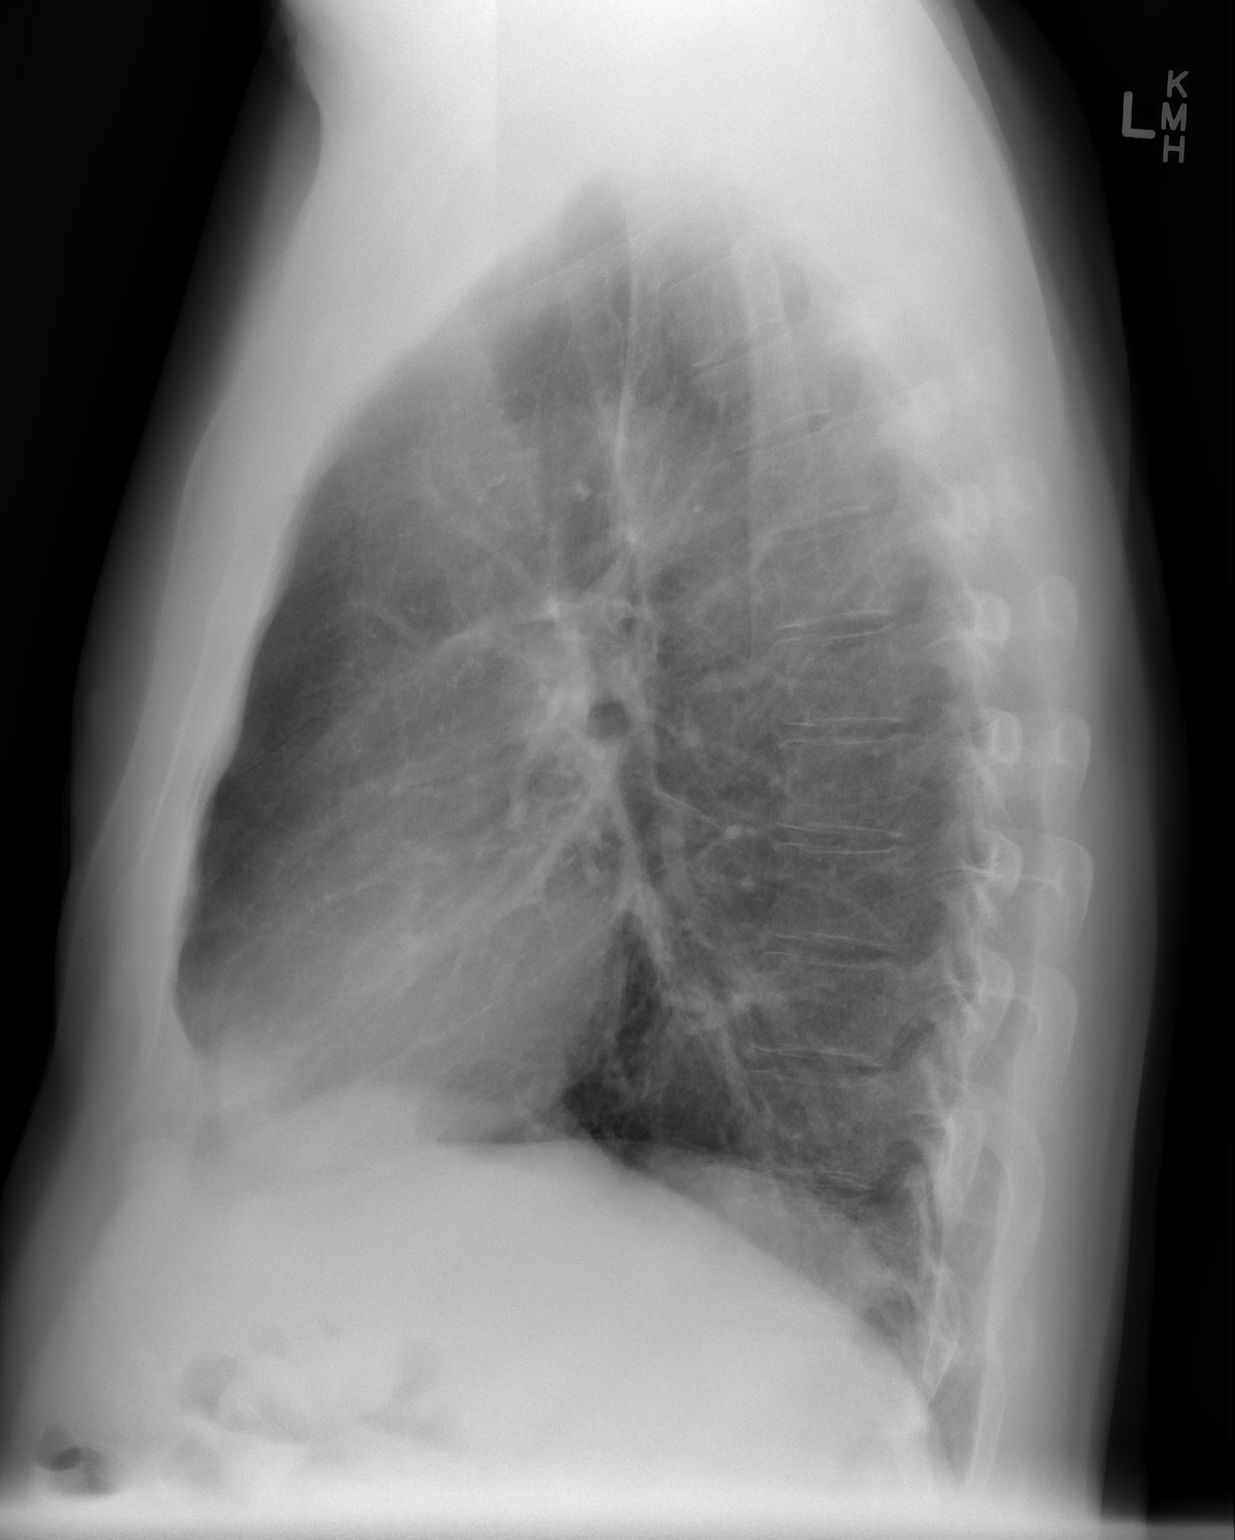

[2 of 2 positions shown; findings below may reference images not displayed]

FINDINGS: There is a nodular opacity at the left base which
probably represents a nipple shadow.  Elsewhere, lungs clear.
Heart size and pulmonary vascularity normal.  No adenopathy.  No
bone lesions.
IMPRESSION: Suspect nipple shadow left base.  Advise repeat study
with nipple markers to confirm.  Elsewhere lungs clear.  No
adenopathy or bone lesions.

## 2015-01-05 ENCOUNTER — Ambulatory Visit (HOSPITAL_COMMUNITY)
Admission: RE | Admit: 2015-01-05 | Discharge: 2015-01-05 | Disposition: A | Discharge status: Home / Self Care | Payer: BLUE CROSS/BLUE SHIELD | Source: Ambulatory Visit | Attending: Urology | Admitting: Urology

## 2015-01-05 ENCOUNTER — Other Ambulatory Visit (HOSPITAL_COMMUNITY): Payer: Self-pay | Admitting: Urology

## 2015-01-05 DIAGNOSIS — C641 Malignant neoplasm of right kidney, except renal pelvis: Secondary | ICD-10-CM

## 2015-01-05 DIAGNOSIS — I10 Essential (primary) hypertension: Secondary | ICD-10-CM | POA: Insufficient documentation

## 2015-04-06 ENCOUNTER — Other Ambulatory Visit: Payer: Self-pay | Admitting: Dermatology

## 2015-12-29 DIAGNOSIS — N402 Nodular prostate without lower urinary tract symptoms: Secondary | ICD-10-CM | POA: Diagnosis not present

## 2015-12-29 DIAGNOSIS — C641 Malignant neoplasm of right kidney, except renal pelvis: Secondary | ICD-10-CM | POA: Diagnosis not present

## 2016-01-04 ENCOUNTER — Other Ambulatory Visit (HOSPITAL_COMMUNITY): Payer: Self-pay | Admitting: Urology

## 2016-01-04 ENCOUNTER — Ambulatory Visit (HOSPITAL_COMMUNITY)
Admission: RE | Admit: 2016-01-04 | Discharge: 2016-01-04 | Disposition: A | Discharge status: Home / Self Care | Payer: BLUE CROSS/BLUE SHIELD | Source: Ambulatory Visit | Attending: Urology | Admitting: Urology

## 2016-01-04 DIAGNOSIS — Z8551 Personal history of malignant neoplasm of bladder: Secondary | ICD-10-CM | POA: Diagnosis not present

## 2016-01-04 DIAGNOSIS — C641 Malignant neoplasm of right kidney, except renal pelvis: Secondary | ICD-10-CM | POA: Diagnosis not present

## 2016-01-04 DIAGNOSIS — N402 Nodular prostate without lower urinary tract symptoms: Secondary | ICD-10-CM | POA: Diagnosis not present

## 2016-07-25 DIAGNOSIS — D229 Melanocytic nevi, unspecified: Secondary | ICD-10-CM | POA: Diagnosis not present

## 2016-07-25 DIAGNOSIS — L738 Other specified follicular disorders: Secondary | ICD-10-CM | POA: Diagnosis not present

## 2017-03-29 ENCOUNTER — Encounter: Payer: Self-pay | Admitting: Internal Medicine

## 2017-04-17 ENCOUNTER — Ambulatory Visit: Payer: BLUE CROSS/BLUE SHIELD | Admitting: Gastroenterology

## 2017-04-17 ENCOUNTER — Encounter: Payer: Self-pay | Admitting: Gastroenterology

## 2017-04-17 ENCOUNTER — Encounter (INDEPENDENT_AMBULATORY_CARE_PROVIDER_SITE_OTHER): Payer: Self-pay

## 2017-04-17 VITALS — BP 176/100 | HR 108 | Ht 69.25 in | Wt 178.2 lb

## 2017-04-17 DIAGNOSIS — K625 Hemorrhage of anus and rectum: Secondary | ICD-10-CM

## 2017-04-17 DIAGNOSIS — R194 Change in bowel habit: Secondary | ICD-10-CM | POA: Diagnosis not present

## 2017-04-17 MED ORDER — NA SULFATE-K SULFATE-MG SULF 17.5-3.13-1.6 GM/177ML PO SOLN: 1.0000 | ORAL | 0 refills | Status: DC

## 2017-04-17 NOTE — Progress Notes (Signed)
04/17/2017 Grant Watkins [id number] September 27, 1955   HISTORY OF PRESENT ILLNESS: This is a pleasant 62 year old male who is new to our office.  He is self-referred.  He presents here today with complaints of change in bowel habits with loose, frequent stools and rectal bleeding.  He says that this all began about 6 months ago and prior to that he was moving his bowels regularly, once a day.  Now he has been going several times per day, usually about 3 times, but they are all very loose.  He has noticed a change in caliber of the stool as well, says that they are flatter, not round.  Did see bright red blood in his stool last week.  No abdominal pain but complains of low back pain.  No weight loss.  Has history of renal cell carcinoma and bladder cancer.  No FH of colon cancer.  Never had colonoscopy in the past.   Past Medical History:  Diagnosis Date  . Bladder cancer ([place])   . Hypertension   . Kidney carcinoma [facility])    Past Surgical History:  Procedure Laterality Date  . BLADDER SURGERY     x 6  . CYSTOSCOPY     7 or 8  surgeries  . CYSTOSCOPY W/ RETROGRADES Left 09/29/2012   Procedure: CYSTOSCOPY WITH LEFT  RETROGRADE PYELOGRAM;  Surgeon: Dutch Gray, MD;  Location: [facility];  Service: Urology;  Laterality: Left;  . CYSTOSCOPY WITH BIOPSY N/A 09/29/2012   Procedure: CYSTOSCOPY WITH BIOPSY;  Surgeon: Dutch Gray, MD;  Location: [facility];  Service: Urology;  Laterality: N/A;  BLADDER BIOPSIES    . INGUINAL HERNIA REPAIR Bilateral    as child  . NEPHRECTOMY Right     reports that he has been smoking cigarettes.  He has a 20.00 pack-year smoking history. he has never used smokeless tobacco. He reports that he drinks alcohol. He reports that he does not use drugs. family history is not on file. He was adopted. No Known Allergies    Outpatient Encounter Medications as of 04/17/2017  Medication Sig  . aspirin 325 MG tablet Take 650 mg by mouth daily.  . [DISCONTINUED] amLODipine  (NORVASC) 5 MG tablet Take 5 mg by mouth every morning.  . [DISCONTINUED] HYDROcodone-acetaminophen (NORCO/VICODIN) 5-325 MG per tablet Take 1-2 tablets by mouth every 6 (six) hours as needed for pain.  . [DISCONTINUED] lisinopril (PRINIVIL,ZESTRIL) 5 MG tablet Take 5 mg by mouth every morning.  . [DISCONTINUED] phenazopyridine (PYRIDIUM) 100 MG tablet Take 1 tablet (100 mg total) by mouth 3 (three) times daily as needed for pain (for burning).   No facility-administered encounter medications on file as of 04/17/2017.      REVIEW OF SYSTEMS  : All other systems reviewed and negative except where noted in the History of Present Illness.   PHYSICAL EXAM: BP (!) 176/100 (BP Location: Left Arm, Patient Position: Sitting, Cuff Size: Normal)   Pulse (!) 108   Ht 5' 9.25" (1.759 m) Comment: height measured without shoes  Wt 178 lb 4 oz (80.9 kg)   BMI 26.13 kg/m  General: Well developed white male in no acute distress Head: Normocephalic and atraumatic Eyes:  Sclerae anicteric, conjunctiva pink. Ears: Normal auditory acuity Lungs: Clear throughout to auscultation; no increased WOB. Heart: Regular rate and rhythm; no M/R/G. Abdomen: Soft, non-distended.  BS present.  Non-tender. Rectal:  Will be done at the time of colonoscopy. Musculoskeletal: Symmetrical with no gross deformities  Skin: No lesions  on visible extremities Extremities: No edema  Neurological: Alert oriented x 4, grossly non-focal Psychological:  Alert and cooperative. Normal mood and affect  ASSESSMENT AND PLAN: *62 year old male with rectal bleeding, change in bowel habits with loose stools, and complaints of low back pain:  Never had colonoscopy in the past.  Will schedule with Dr. Henrene Pastor.  **The risks, benefits, and alternatives to colonoscopy were discussed with the patient and he consents to proceed.   CC:  Hulan Fess, MD

## 2017-04-17 NOTE — Patient Instructions (Signed)

## 2017-04-18 NOTE — Progress Notes (Signed)
Assessment and plans reviewed  

## 2017-04-24 ENCOUNTER — Encounter: Payer: Self-pay | Admitting: Internal Medicine

## 2017-05-03 DIAGNOSIS — Z8551 Personal history of malignant neoplasm of bladder: Secondary | ICD-10-CM | POA: Diagnosis not present

## 2017-05-03 DIAGNOSIS — R7301 Impaired fasting glucose: Secondary | ICD-10-CM | POA: Diagnosis not present

## 2017-05-03 DIAGNOSIS — Z85528 Personal history of other malignant neoplasm of kidney: Secondary | ICD-10-CM | POA: Diagnosis not present

## 2017-05-03 DIAGNOSIS — I10 Essential (primary) hypertension: Secondary | ICD-10-CM | POA: Diagnosis not present

## 2017-05-07 ENCOUNTER — Ambulatory Visit: Payer: BLUE CROSS/BLUE SHIELD | Admitting: Internal Medicine

## 2017-05-08 ENCOUNTER — Encounter: Payer: Self-pay | Admitting: Internal Medicine

## 2017-05-08 ENCOUNTER — Ambulatory Visit (AMBULATORY_SURGERY_CENTER): Payer: BLUE CROSS/BLUE SHIELD | Admitting: Internal Medicine

## 2017-05-08 ENCOUNTER — Other Ambulatory Visit: Payer: Self-pay

## 2017-05-08 VITALS — BP 120/87 | HR 83 | Temp 98.0°F | Resp 12 | Ht 69.0 in | Wt 178.0 lb

## 2017-05-08 DIAGNOSIS — K625 Hemorrhage of anus and rectum: Secondary | ICD-10-CM

## 2017-05-08 DIAGNOSIS — D128 Benign neoplasm of rectum: Secondary | ICD-10-CM

## 2017-05-08 DIAGNOSIS — K921 Melena: Secondary | ICD-10-CM | POA: Diagnosis not present

## 2017-05-08 DIAGNOSIS — K635 Polyp of colon: Secondary | ICD-10-CM

## 2017-05-08 DIAGNOSIS — D122 Benign neoplasm of ascending colon: Secondary | ICD-10-CM

## 2017-05-08 DIAGNOSIS — D123 Benign neoplasm of transverse colon: Secondary | ICD-10-CM

## 2017-05-08 DIAGNOSIS — D124 Benign neoplasm of descending colon: Secondary | ICD-10-CM | POA: Diagnosis not present

## 2017-05-08 DIAGNOSIS — D125 Benign neoplasm of sigmoid colon: Secondary | ICD-10-CM | POA: Diagnosis not present

## 2017-05-08 DIAGNOSIS — R194 Change in bowel habit: Secondary | ICD-10-CM | POA: Diagnosis not present

## 2017-05-08 MED ORDER — SODIUM CHLORIDE 0.9 % IV SOLN: 500.0000 mL | Freq: Once | INTRAVENOUS | Status: DC

## 2017-05-08 NOTE — Progress Notes (Signed)
Report to PACU, RN, vss, BBS= Clear.  

## 2017-05-08 NOTE — Patient Instructions (Signed)
Discharge instructions given. Handouts on polyps and diverticulosis. Resume previous medications. Per Dr. Henrene Pastor await pathology before scheduling repeat Colonoscopy. YOU HAD AN ENDOSCOPIC PROCEDURE TODAY AT [facility]:   Refer to the procedure report that was given to you for any specific questions about what was found during the examination.  If the procedure report does not answer your questions, please call your gastroenterologist to clarify.  If you requested that your care partner not be given the details of your procedure findings, then the procedure report has been included in a sealed envelope for you to review at your convenience later.  YOU SHOULD EXPECT: Some feelings of bloating in the abdomen. Passage of more gas than usual.  Walking can help get rid of the air that was put into your GI tract during the procedure and reduce the bloating. If you had a lower endoscopy (such as a colonoscopy or flexible sigmoidoscopy) you may notice spotting of blood in your stool or on the toilet paper. If you underwent a bowel prep for your procedure, you may not have a normal bowel movement for a few days.  Please Note:  You might notice some irritation and congestion in your nose or some drainage.  This is from the oxygen used during your procedure.  There is no need for concern and it should clear up in a day or so.  SYMPTOMS TO REPORT IMMEDIATELY:   Following lower endoscopy (colonoscopy or flexible sigmoidoscopy):  Excessive amounts of blood in the stool  Significant tenderness or worsening of abdominal pains  Swelling of the abdomen that is new, acute  Fever of 100F or higher  For urgent or emergent issues, a gastroenterologist can be reached at any hour by calling [phone].   DIET:  We do recommend a small meal at first, but then you may proceed to your regular diet.  Drink plenty of fluids but you should avoid alcoholic beverages for 24 hours.  ACTIVITY:  You  should plan to take it easy for the rest of today and you should NOT DRIVE or use heavy machinery until tomorrow (because of the sedation medicines used during the test).    FOLLOW UP: Our staff will call the number listed on your records the next business day following your procedure to check on you and address any questions or concerns that you may have regarding the information given to you following your procedure. If we do not reach you, we will leave a message.  However, if you are feeling well and you are not experiencing any problems, there is no need to return our call.  We will assume that you have returned to your regular daily activities without incident.  If any biopsies were taken you will be contacted by phone or by letter within the next 1-3 weeks.  Please call us at [phone] if you have not heard about the biopsies in 3 weeks.    SIGNATURES/CONFIDENTIALITY: You and/or your care partner have signed paperwork which will be entered into your electronic medical record.  These signatures attest to the fact that that the information above on your After Visit Summary has been reviewed and is understood.  Full responsibility of the confidentiality of this discharge information lies with you and/or your care-partner.

## 2017-05-08 NOTE — Op Note (Addendum)
Grant Watkins Patient Name: Grant Watkins Procedure Date: 05/08/2017 10:12 AM MRN: [id number] Endoscopist: Docia Chuck. Henrene Pastor , MD Age: 62 Referring MD:  Date of Birth: 1955/04/01 Gender: Male Account #: [id number] Procedure:                Colonoscopy, With cold snare polypectomy x 9; hot                            snare x 2;submucosal injection Indications:              Rectal bleeding Medicines:                Monitored Anesthesia Care Procedure:                Pre-Anesthesia Assessment:                           - Prior to the procedure, a History and Physical                            was performed, and patient medications and                            allergies were reviewed. The patient's tolerance of                            previous anesthesia was also reviewed. The risks                            and benefits of the procedure and the sedation                            options and risks were discussed with the patient.                            All questions were answered, and informed consent                            was obtained. Prior Anticoagulants: The patient has                            taken no previous anticoagulant or antiplatelet                            agents. ASA Grade Assessment: II - A patient with                            mild systemic disease. After reviewing the risks                            and benefits, the patient was deemed in                            satisfactory condition to undergo the procedure.  After obtaining informed consent, the colonoscope                            was passed under direct vision. Throughout the                            procedure, the patient's blood pressure, pulse, and                            oxygen saturations were monitored continuously. The                            Colonoscope was introduced through the anus and                            advanced to the the cecum,  identified by                            appendiceal orifice and ileocecal valve. The                            ileocecal valve, appendiceal orifice, and rectum                            were photographed. The quality of the bowel                            preparation was excellent. The colonoscopy was                            performed without difficulty. The patient tolerated                            the procedure well. The bowel preparation used was                            SUPREP. Scope In: 10:28:06 AM Scope Out: 11:12:12 AM Scope Withdrawal Time: 0 hours 36 minutes 32 seconds  Total Procedure Duration: 0 hours 44 minutes 6 seconds  Findings:                 Nine polyps were found in the sigmoid colon,                            descending colon, transverse colon and ascending                            colon. The polyps were 4 to 10 mm in size. These                            polyps were removed with a cold snare. Resection                            and retrieval were complete.  A 20 mm polyp was found in the sigmoid colon. The                            polyp was pedunculated. The polyp was removed with                            a hot snare. Resection and retrieval were complete.                           A 25 mm polyp was found in the rectum 2 cm from the                            anal verge. The polyp was semi-pedunculated. The                            polyp was removed PIECEMEAL with a saline                            injection-lift technique (Completely raised) using                            a hot snare. Resection and retrieval were complete.                            Area was tattooed with an injection of Spot (carbon                            black). Injection was placed on all 4 sides                            surrounding the polypectomy site.                           Diverticula were found in the sigmoid colon.                            The exam was otherwise without abnormality on                            direct and retroflexion views. Complications:            No immediate complications. Estimated blood loss:                            None. Estimated Blood Loss:     Estimated blood loss: none. Impression:               - Nine 4 to 10 mm polyps in the sigmoid colon, in                            the descending colon, in the transverse colon and  in the ascending colon, removed with a cold snare.                            Resected and retrieved.                           - One 20 mm polyp in the sigmoid colon, removed                            with a hot snare. Resected and retrieved.                           - One 25 mm polyp in the rectum, removed using                            injection-lift and a hot snare. Resected and                            retrieved. Tattooed.                           - Diverticulosis in the sigmoid colon.                           - The examination was otherwise normal on direct                            and retroflexion views. Recommendation:           - Repeat colonoscopy in 3 months for surveillance,                            If no cancer in rectal lesion.                           - Patient has a contact number available for                            emergencies. The signs and symptoms of potential                            delayed complications were discussed with the                            patient. Return to normal activities tomorrow.                            Written discharge instructions were provided to the                            patient.                           - Resume previous diet.                           -  Continue present medications.                           - Await pathology results.                           - Please arrange consultation with genetic                            counselor at [facility] if not  previously done                            "history of kidney cancer, bladder cancer, and                            multiple polyposis" Docia Chuck. Henrene Pastor, MD 05/08/2017 11:29:14 AM This report has been signed electronically.

## 2017-05-08 NOTE — Progress Notes (Signed)
Pt's states no medical or surgical changes since previsit or office visit. 

## 2017-05-08 NOTE — Progress Notes (Signed)
Called to room to assist during endoscopic procedure.  Patient ID and intended procedure confirmed with present staff. Received instructions for my participation in the procedure from the performing physician.  

## 2017-05-09 ENCOUNTER — Telehealth: Payer: Self-pay

## 2017-05-09 ENCOUNTER — Other Ambulatory Visit: Payer: Self-pay

## 2017-05-09 ENCOUNTER — Telehealth: Payer: Self-pay | Admitting: *Deleted

## 2017-05-09 DIAGNOSIS — D126 Benign neoplasm of colon, unspecified: Secondary | ICD-10-CM

## 2017-05-09 NOTE — Telephone Encounter (Signed)
Invalid number given , called home number no answer.

## 2017-05-09 NOTE — Telephone Encounter (Signed)
No answer Grant Watkins/2nd call-back attemopt

## 2017-05-13 ENCOUNTER — Encounter: Payer: Self-pay | Admitting: Genetic Counselor

## 2017-05-13 ENCOUNTER — Telehealth: Payer: Self-pay | Admitting: Genetic Counselor

## 2017-05-13 NOTE — Telephone Encounter (Signed)
A genetic counseling appt has been scheduled for the pt to see Ofri on 3/14 at 1230pm. The appt date and time has been given to the pt's wife. Letter mailed.

## 2017-05-14 ENCOUNTER — Encounter: Payer: Self-pay | Admitting: Internal Medicine

## 2017-05-21 DIAGNOSIS — R7301 Impaired fasting glucose: Secondary | ICD-10-CM | POA: Diagnosis not present

## 2017-05-21 DIAGNOSIS — I10 Essential (primary) hypertension: Secondary | ICD-10-CM | POA: Diagnosis not present

## 2017-05-28 DIAGNOSIS — Z0000 Encounter for general adult medical examination without abnormal findings: Secondary | ICD-10-CM | POA: Diagnosis not present

## 2017-05-28 DIAGNOSIS — N183 Chronic kidney disease, stage 3 (moderate): Secondary | ICD-10-CM | POA: Diagnosis not present

## 2017-05-28 DIAGNOSIS — E1122 Type 2 diabetes mellitus with diabetic chronic kidney disease: Secondary | ICD-10-CM | POA: Diagnosis not present

## 2017-05-28 DIAGNOSIS — I10 Essential (primary) hypertension: Secondary | ICD-10-CM | POA: Diagnosis not present

## 2017-05-30 ENCOUNTER — Inpatient Hospital Stay: Payer: BLUE CROSS/BLUE SHIELD | Attending: Genetic Counselor | Admitting: Genetic Counselor

## 2017-05-30 ENCOUNTER — Encounter: Payer: Self-pay | Admitting: Genetic Counselor

## 2017-05-30 ENCOUNTER — Inpatient Hospital Stay: Payer: BLUE CROSS/BLUE SHIELD

## 2017-05-30 DIAGNOSIS — Z315 Encounter for genetic counseling: Secondary | ICD-10-CM

## 2017-05-30 DIAGNOSIS — Z8551 Personal history of malignant neoplasm of bladder: Secondary | ICD-10-CM | POA: Diagnosis not present

## 2017-05-30 DIAGNOSIS — Z86010 Personal history of colonic polyps: Secondary | ICD-10-CM

## 2017-05-30 DIAGNOSIS — Z0282 Encounter for adoption services: Secondary | ICD-10-CM | POA: Insufficient documentation

## 2017-05-30 DIAGNOSIS — Z85528 Personal history of other malignant neoplasm of kidney: Secondary | ICD-10-CM | POA: Diagnosis not present

## 2017-05-30 HISTORY — DX: Personal history of malignant neoplasm of bladder: Z85.51

## 2017-05-30 HISTORY — DX: Personal history of other malignant neoplasm of kidney: Z85.528

## 2017-05-30 NOTE — Progress Notes (Addendum)
[facility]      Initial Visit   Patient Name: Grant Watkins Patient DOB: [birth year] Patient Age: 62 y.o. Encounter Date: 05/30/2017  Referring Provider: Scarlette Shorts, MD  Reason for Visit: Evaluate for hereditary susceptibility to cancer    Assessment and Plan:  . Grant Watkins's history is somewhat suggestive of a hereditary predisposition to cancer. He is adopted so his family history is not helpful in providing him a risk assessment.   . Testing is recommended to determine whether he has a pathogenic mutation that will impact his screening and risk-reduction for cancer.   . Mr. Glaza wished to pursue genetic testing and a blood sample will be sent for analysis of the 83 genes on [facility]'s Multi-Cancer panel (ALK, APC, ATM, AXIN2, BAP1, BARD1, BLM, BMPR1A, BRCA1, BRCA2, BRIP1, CASR, CDC73, CDH1, CDK4, CDKN1B, CDKN1C, CDKN2A, CEBPA, CHEK2, CTNNA1, DICER1, DIS3L2, EGFR, EPCAM, FH, FLCN, GATA2, GPC3, GREM1, HOXB13, HRAS, KIT, MAX, MEN1, MET, MITF, MLH1, MSH2, MSH3, MSH6, MUTYH, NBN, NF1, NF2, NTHL1, PALB2, PDGFRA, PHOX2B, PMS2, POLD1, POLE, POT1, PRKAR1A, PTCH1, PTEN, RAD50, RAD51C, RAD51D, RB1, RECQL4, RET, RUNX1, SDHA, SDHAF2, SDHB, SDHC, SDHD, SMAD4, SMARCA4, SMARCB1, SMARCE1, STK11, SUFU, TERC, TERT, TMEM127, TP53, TSC1, TSC2, VHL, WRN, WT1). ADDENDUM 07/01/2017: [name] opted cancel his genetic testing due to cost.  . Results should be available in approximately 2-4 weeks, at which point we will contact him and address implications for him as well as address genetic testing for at-risk family members, if needed.     Dr. Jana Watkins was available for questions concerning this case. Total time spent by me in face-to-face counseling was approximately 30 minutes.   _____________________________________________________________________   History of Present Illness: [name], a 62 y.o. male, is being seen at the [facility] due  to a personal history of cancer and colon polyps. He presents to clinic today with his wife to discuss the possibility of a hereditary predisposition to cancer and discuss whether genetic testing is warranted.  Mr. Norwood has an extensive history of cancer. He reported a history of bladder cancer initially diagnosed in 2002 at age 50. He has had 6 bladder surgeries. He had kidney cancer diagnosed in 2012 at age 31.  Recently he had his first colonoscopy and 11 tubular adenomas were removed from various parts of his colon. Nine of them were 4-81m. 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 He is scheduled for another colonoscopy in 3 months.  Past Medical History:  Diagnosis Date  . Adopted   . [place] . History of bladder cancer 05/30/2017  . History of kidney cancer 05/30/2017  . Hypertension   [facility]   Past Surgical History:  Procedure Laterality Date  . BLADDER SURGERY     x 6  . CYSTOSCOPY     7 or 8  surgeries  . CYSTOSCOPY W/ RETROGRADES Left 09/29/2012   Procedure: CYSTOSCOPY WITH LEFT  RETROGRADE PYELOGRAM;  Surgeon: LesDutch GrayD;  Location: [facility];  Service: Urology;  Laterality: Left;  . CYSTOSCOPY WITH BIOPSY N/A 09/29/2012   Procedure: CYSTOSCOPY WITH BIOPSY;  Surgeon: LesDutch GrayD;  Location: [facility];  Service: Urology;  Laterality: N/A;  BLADDER BIOPSIES    . INGUINAL HERNIA REPAIR Bilateral    as child  . NEPHRECTOMY Right     Social History  Socioeconomic History  . Marital status: Married    Spouse name: Not on file  . Number of children: 2  . Years of education: Not on file  . Highest education level: Not on file  Social Needs  . Financial resource strain: Not on file  . Food insecurity - worry: Not on file  . Food insecurity - inability: Not on file  . Transportation needs - medical: Not on file  . Transportation needs - non-medical: Not on file  Occupational  History  . Occupation: Graphics  Tobacco Use  . Smoking status: Current Every Day Smoker    Packs/day: 0.50    Years: 40.00    Pack years: 20.00    Types: Cigarettes  . Smokeless tobacco: Never Used  Substance and Sexual Activity  . Alcohol use: Yes    Comment: occassionally 1-2 per day  . Drug use: No  . Sexual activity: Not on file  Other Topics Concern  . Not on file  Social History Narrative  . Not on file     Family History:  [name] is adopted. He has no children. He does know that his biological mother is 89. He has a maternal half-brother (late 40s) and a maternal half-sister (late 50s). He has no information about his biological father.  [name]'s ancestry is Japanese on his biological mother's side and reported likely Caucasian - NOS on his biological father's side. It is not known whether or not there is Jewish ancestry.  Discussion: We reviewed the characteristics, features and inheritance patterns of hereditary cancer syndromes. We discussed his risk of harboring a mutation in the context of his personal and family history. We discussed that his unknown family history makes risk assessment challenging. We discussed the process of genetic testing, insurance coverage and implications of results: positive, negative and variant of unknown significance (VUS).    Mr. Fosberg's questions were answered to his satisfaction today and he is welcome to call with any additional questions or concerns. Thank you for the referral and allowing us to share in the care of your patient.    Ofri Leitner, MS, CGC Certified Genetic Counselor phone: [phone] [email or web address] 

## 2017-06-27 DIAGNOSIS — C641 Malignant neoplasm of right kidney, except renal pelvis: Secondary | ICD-10-CM | POA: Diagnosis not present

## 2017-06-27 DIAGNOSIS — M545 Low back pain: Secondary | ICD-10-CM | POA: Diagnosis not present

## 2017-06-27 DIAGNOSIS — C678 Malignant neoplasm of overlapping sites of bladder: Secondary | ICD-10-CM | POA: Diagnosis not present

## 2017-07-01 NOTE — Progress Notes (Signed)
Grant Watkins opted cancel his genetic testing due to cost.

## 2017-07-15 DIAGNOSIS — C641 Malignant neoplasm of right kidney, except renal pelvis: Secondary | ICD-10-CM | POA: Diagnosis not present

## 2017-07-22 DIAGNOSIS — K449 Diaphragmatic hernia without obstruction or gangrene: Secondary | ICD-10-CM | POA: Diagnosis not present

## 2017-07-22 DIAGNOSIS — C641 Malignant neoplasm of right kidney, except renal pelvis: Secondary | ICD-10-CM | POA: Diagnosis not present

## 2017-08-02 ENCOUNTER — Encounter: Payer: Self-pay | Admitting: Internal Medicine

## 2017-08-28 DIAGNOSIS — Z1159 Encounter for screening for other viral diseases: Secondary | ICD-10-CM | POA: Diagnosis not present

## 2017-08-28 DIAGNOSIS — I10 Essential (primary) hypertension: Secondary | ICD-10-CM | POA: Diagnosis not present

## 2017-08-28 DIAGNOSIS — E1122 Type 2 diabetes mellitus with diabetic chronic kidney disease: Secondary | ICD-10-CM | POA: Diagnosis not present

## 2017-08-28 DIAGNOSIS — N183 Chronic kidney disease, stage 3 (moderate): Secondary | ICD-10-CM | POA: Diagnosis not present

## 2017-10-03 DIAGNOSIS — N402 Nodular prostate without lower urinary tract symptoms: Secondary | ICD-10-CM | POA: Diagnosis not present

## 2017-10-09 DIAGNOSIS — N402 Nodular prostate without lower urinary tract symptoms: Secondary | ICD-10-CM | POA: Diagnosis not present

## 2017-10-09 DIAGNOSIS — Z8551 Personal history of malignant neoplasm of bladder: Secondary | ICD-10-CM | POA: Diagnosis not present

## 2017-10-09 DIAGNOSIS — Z85528 Personal history of other malignant neoplasm of kidney: Secondary | ICD-10-CM | POA: Diagnosis not present

## 2018-04-17 DIAGNOSIS — I10 Essential (primary) hypertension: Secondary | ICD-10-CM | POA: Diagnosis not present

## 2018-04-17 DIAGNOSIS — N183 Chronic kidney disease, stage 3 (moderate): Secondary | ICD-10-CM | POA: Diagnosis not present

## 2018-04-17 DIAGNOSIS — E1122 Type 2 diabetes mellitus with diabetic chronic kidney disease: Secondary | ICD-10-CM | POA: Diagnosis not present

## 2018-09-04 DIAGNOSIS — E7800 Pure hypercholesterolemia, unspecified: Secondary | ICD-10-CM | POA: Diagnosis not present

## 2018-09-04 DIAGNOSIS — I10 Essential (primary) hypertension: Secondary | ICD-10-CM | POA: Diagnosis not present

## 2018-09-04 DIAGNOSIS — E1122 Type 2 diabetes mellitus with diabetic chronic kidney disease: Secondary | ICD-10-CM | POA: Diagnosis not present

## 2018-12-24 DIAGNOSIS — L723 Sebaceous cyst: Secondary | ICD-10-CM | POA: Diagnosis not present

## 2019-01-07 DIAGNOSIS — N402 Nodular prostate without lower urinary tract symptoms: Secondary | ICD-10-CM | POA: Diagnosis not present

## 2019-01-14 DIAGNOSIS — Z8551 Personal history of malignant neoplasm of bladder: Secondary | ICD-10-CM | POA: Diagnosis not present

## 2019-01-14 DIAGNOSIS — N402 Nodular prostate without lower urinary tract symptoms: Secondary | ICD-10-CM | POA: Diagnosis not present

## 2019-02-20 DIAGNOSIS — H40053 Ocular hypertension, bilateral: Secondary | ICD-10-CM | POA: Diagnosis not present

## 2019-02-20 DIAGNOSIS — E119 Type 2 diabetes mellitus without complications: Secondary | ICD-10-CM | POA: Diagnosis not present

## 2019-05-25 DIAGNOSIS — Z0000 Encounter for general adult medical examination without abnormal findings: Secondary | ICD-10-CM | POA: Diagnosis not present

## 2019-05-25 DIAGNOSIS — E1122 Type 2 diabetes mellitus with diabetic chronic kidney disease: Secondary | ICD-10-CM | POA: Diagnosis not present

## 2019-05-25 DIAGNOSIS — I10 Essential (primary) hypertension: Secondary | ICD-10-CM | POA: Diagnosis not present

## 2019-05-25 DIAGNOSIS — E7800 Pure hypercholesterolemia, unspecified: Secondary | ICD-10-CM | POA: Diagnosis not present

## 2019-05-25 DIAGNOSIS — N183 Chronic kidney disease, stage 3 unspecified: Secondary | ICD-10-CM | POA: Diagnosis not present

## 2019-05-28 ENCOUNTER — Ambulatory Visit: Payer: Self-pay | Attending: Internal Medicine

## 2019-05-28 DIAGNOSIS — Z23 Encounter for immunization: Secondary | ICD-10-CM

## 2019-05-28 NOTE — Progress Notes (Signed)
   Covid-19 Vaccination Clinic  Name:  Grant Watkins    MRN: [id number] DOB: 1955-09-10  05/28/2019  [name] was observed post Covid-19 immunization for 15 minutes without incident. He was provided with Vaccine Information Sheet and instruction to access the V-Safe system.   [name] was instructed to call 911 with any severe reactions post vaccine: Marland Kitchen Difficulty breathing  . Swelling of face and throat  . A fast heartbeat  . A bad rash all over body  . Dizziness and weakness   Immunizations Administered    Name Date Dose VIS Date Route   Pfizer COVID-19 Vaccine 05/28/2019  8:12 AM 0.3 mL 02/27/2019 Intramuscular   Manufacturer: [place]   Lot: [id number]   [place]: [id number]

## 2019-06-22 ENCOUNTER — Ambulatory Visit: Payer: Self-pay | Attending: Internal Medicine

## 2019-06-22 DIAGNOSIS — Z23 Encounter for immunization: Secondary | ICD-10-CM

## 2019-06-22 NOTE — Progress Notes (Signed)
   Covid-19 Vaccination Clinic  Name:  FAWZI LAVINDER    MRN: [id number] DOB: October 06, 1955  06/22/2019  Mr. Petrides was observed post Covid-19 immunization for 15 minutes without incident. He was provided with Vaccine Information Sheet and instruction to access the V-Safe system.   Mr. Wride was instructed to call 911 with any severe reactions post vaccine: Marland Kitchen Difficulty breathing  . Swelling of face and throat  . A fast heartbeat  . A bad rash all over body  . Dizziness and weakness   Immunizations Administered    Name Date Dose VIS Date Route   Pfizer COVID-19 Vaccine 06/22/2019  8:24 AM 0.3 mL 02/27/2019 Intramuscular   Manufacturer: [place]   Lot: [id number]   [place]: [id number]

## 2019-06-26 DIAGNOSIS — E039 Hypothyroidism, unspecified: Secondary | ICD-10-CM | POA: Diagnosis not present

## 2019-11-26 DIAGNOSIS — E1122 Type 2 diabetes mellitus with diabetic chronic kidney disease: Secondary | ICD-10-CM | POA: Diagnosis not present

## 2019-11-26 DIAGNOSIS — I10 Essential (primary) hypertension: Secondary | ICD-10-CM | POA: Diagnosis not present

## 2019-12-09 DIAGNOSIS — L578 Other skin changes due to chronic exposure to nonionizing radiation: Secondary | ICD-10-CM | POA: Diagnosis not present

## 2019-12-09 DIAGNOSIS — L820 Inflamed seborrheic keratosis: Secondary | ICD-10-CM | POA: Diagnosis not present

## 2019-12-09 DIAGNOSIS — L821 Other seborrheic keratosis: Secondary | ICD-10-CM | POA: Diagnosis not present

## 2020-01-03 DIAGNOSIS — R519 Headache, unspecified: Secondary | ICD-10-CM | POA: Diagnosis not present

## 2020-01-03 DIAGNOSIS — J029 Acute pharyngitis, unspecified: Secondary | ICD-10-CM | POA: Diagnosis not present

## 2020-01-03 DIAGNOSIS — R197 Diarrhea, unspecified: Secondary | ICD-10-CM | POA: Diagnosis not present

## 2020-01-03 DIAGNOSIS — Z20822 Contact with and (suspected) exposure to covid-19: Secondary | ICD-10-CM | POA: Diagnosis not present

## 2020-06-02 DIAGNOSIS — N183 Chronic kidney disease, stage 3 unspecified: Secondary | ICD-10-CM | POA: Diagnosis not present

## 2020-06-02 DIAGNOSIS — E559 Vitamin D deficiency, unspecified: Secondary | ICD-10-CM | POA: Diagnosis not present

## 2020-06-02 DIAGNOSIS — E039 Hypothyroidism, unspecified: Secondary | ICD-10-CM | POA: Diagnosis not present

## 2020-06-02 DIAGNOSIS — I10 Essential (primary) hypertension: Secondary | ICD-10-CM | POA: Diagnosis not present

## 2020-06-02 DIAGNOSIS — Z0000 Encounter for general adult medical examination without abnormal findings: Secondary | ICD-10-CM | POA: Diagnosis not present

## 2020-06-02 DIAGNOSIS — E7800 Pure hypercholesterolemia, unspecified: Secondary | ICD-10-CM | POA: Diagnosis not present

## 2020-06-02 DIAGNOSIS — E1122 Type 2 diabetes mellitus with diabetic chronic kidney disease: Secondary | ICD-10-CM | POA: Diagnosis not present

## 2020-06-02 DIAGNOSIS — M19049 Primary osteoarthritis, unspecified hand: Secondary | ICD-10-CM | POA: Diagnosis not present

## 2020-06-30 DIAGNOSIS — M720 Palmar fascial fibromatosis [Dupuytren]: Secondary | ICD-10-CM | POA: Diagnosis not present

## 2020-06-30 DIAGNOSIS — M79641 Pain in right hand: Secondary | ICD-10-CM | POA: Diagnosis not present

## 2020-10-10 DIAGNOSIS — H40013 Open angle with borderline findings, low risk, bilateral: Secondary | ICD-10-CM | POA: Diagnosis not present

## 2020-11-29 DIAGNOSIS — E1122 Type 2 diabetes mellitus with diabetic chronic kidney disease: Secondary | ICD-10-CM | POA: Diagnosis not present

## 2020-11-29 DIAGNOSIS — I10 Essential (primary) hypertension: Secondary | ICD-10-CM | POA: Diagnosis not present

## 2020-11-29 DIAGNOSIS — I959 Hypotension, unspecified: Secondary | ICD-10-CM | POA: Diagnosis not present

## 2020-12-12 DIAGNOSIS — Z8551 Personal history of malignant neoplasm of bladder: Secondary | ICD-10-CM | POA: Diagnosis not present

## 2020-12-12 DIAGNOSIS — N402 Nodular prostate without lower urinary tract symptoms: Secondary | ICD-10-CM | POA: Diagnosis not present

## 2021-06-12 DIAGNOSIS — N183 Chronic kidney disease, stage 3 unspecified: Secondary | ICD-10-CM | POA: Diagnosis not present

## 2021-06-12 DIAGNOSIS — I10 Essential (primary) hypertension: Secondary | ICD-10-CM | POA: Diagnosis not present

## 2021-06-12 DIAGNOSIS — E039 Hypothyroidism, unspecified: Secondary | ICD-10-CM | POA: Diagnosis not present

## 2021-06-12 DIAGNOSIS — E1122 Type 2 diabetes mellitus with diabetic chronic kidney disease: Secondary | ICD-10-CM | POA: Diagnosis not present

## 2021-06-12 DIAGNOSIS — E559 Vitamin D deficiency, unspecified: Secondary | ICD-10-CM | POA: Diagnosis not present

## 2021-06-12 DIAGNOSIS — E7800 Pure hypercholesterolemia, unspecified: Secondary | ICD-10-CM | POA: Diagnosis not present

## 2021-06-12 DIAGNOSIS — Z0000 Encounter for general adult medical examination without abnormal findings: Secondary | ICD-10-CM | POA: Diagnosis not present

## 2021-10-13 DIAGNOSIS — H40013 Open angle with borderline findings, low risk, bilateral: Secondary | ICD-10-CM | POA: Diagnosis not present

## 2022-03-02 DIAGNOSIS — Z8551 Personal history of malignant neoplasm of bladder: Secondary | ICD-10-CM | POA: Diagnosis not present

## 2022-03-02 DIAGNOSIS — N402 Nodular prostate without lower urinary tract symptoms: Secondary | ICD-10-CM | POA: Diagnosis not present

## 2022-03-19 HISTORY — PX: BYPASS GRAFT AORTA TO AORTA: SHX6294

## 2022-05-04 DIAGNOSIS — I350 Nonrheumatic aortic (valve) stenosis: Secondary | ICD-10-CM | POA: Diagnosis not present

## 2022-05-04 DIAGNOSIS — M25431 Effusion, right wrist: Secondary | ICD-10-CM | POA: Diagnosis not present

## 2022-05-04 DIAGNOSIS — I371 Nonrheumatic pulmonary valve insufficiency: Secondary | ICD-10-CM | POA: Diagnosis not present

## 2022-05-04 DIAGNOSIS — R072 Precordial pain: Secondary | ICD-10-CM | POA: Diagnosis not present

## 2022-05-04 DIAGNOSIS — I160 Hypertensive urgency: Secondary | ICD-10-CM | POA: Diagnosis not present

## 2022-05-04 DIAGNOSIS — R0789 Other chest pain: Secondary | ICD-10-CM | POA: Diagnosis not present

## 2022-05-04 DIAGNOSIS — I10 Essential (primary) hypertension: Secondary | ICD-10-CM | POA: Diagnosis not present

## 2022-05-04 DIAGNOSIS — R9439 Abnormal result of other cardiovascular function study: Secondary | ICD-10-CM | POA: Diagnosis not present

## 2022-05-04 DIAGNOSIS — I82601 Acute embolism and thrombosis of unspecified veins of right upper extremity: Secondary | ICD-10-CM | POA: Diagnosis not present

## 2022-05-04 DIAGNOSIS — I361 Nonrheumatic tricuspid (valve) insufficiency: Secondary | ICD-10-CM | POA: Diagnosis not present

## 2022-05-04 DIAGNOSIS — I493 Ventricular premature depolarization: Secondary | ICD-10-CM | POA: Diagnosis not present

## 2022-05-04 DIAGNOSIS — Z2821 Immunization not carried out because of patient refusal: Secondary | ICD-10-CM | POA: Diagnosis not present

## 2022-05-04 DIAGNOSIS — Z7984 Long term (current) use of oral hypoglycemic drugs: Secondary | ICD-10-CM | POA: Diagnosis not present

## 2022-05-04 DIAGNOSIS — R079 Chest pain, unspecified: Secondary | ICD-10-CM | POA: Diagnosis not present

## 2022-05-04 DIAGNOSIS — I25119 Atherosclerotic heart disease of native coronary artery with unspecified angina pectoris: Secondary | ICD-10-CM | POA: Diagnosis not present

## 2022-05-04 DIAGNOSIS — E119 Type 2 diabetes mellitus without complications: Secondary | ICD-10-CM | POA: Diagnosis not present

## 2022-05-04 DIAGNOSIS — Z8551 Personal history of malignant neoplasm of bladder: Secondary | ICD-10-CM | POA: Diagnosis not present

## 2022-05-04 DIAGNOSIS — Z905 Acquired absence of kidney: Secondary | ICD-10-CM | POA: Diagnosis not present

## 2022-05-04 DIAGNOSIS — I2510 Atherosclerotic heart disease of native coronary artery without angina pectoris: Secondary | ICD-10-CM | POA: Diagnosis not present

## 2022-05-04 DIAGNOSIS — F17200 Nicotine dependence, unspecified, uncomplicated: Secondary | ICD-10-CM | POA: Diagnosis not present

## 2022-05-09 DIAGNOSIS — I2089 Other forms of angina pectoris: Secondary | ICD-10-CM | POA: Diagnosis not present

## 2022-05-09 DIAGNOSIS — F1010 Alcohol abuse, uncomplicated: Secondary | ICD-10-CM | POA: Diagnosis not present

## 2022-05-09 DIAGNOSIS — E785 Hyperlipidemia, unspecified: Secondary | ICD-10-CM | POA: Diagnosis not present

## 2022-05-09 DIAGNOSIS — Z01810 Encounter for preprocedural cardiovascular examination: Secondary | ICD-10-CM | POA: Diagnosis not present

## 2022-05-09 DIAGNOSIS — E1122 Type 2 diabetes mellitus with diabetic chronic kidney disease: Secondary | ICD-10-CM | POA: Diagnosis not present

## 2022-05-09 DIAGNOSIS — D62 Acute posthemorrhagic anemia: Secondary | ICD-10-CM | POA: Diagnosis not present

## 2022-05-09 DIAGNOSIS — Z79899 Other long term (current) drug therapy: Secondary | ICD-10-CM | POA: Diagnosis not present

## 2022-05-09 DIAGNOSIS — Z006 Encounter for examination for normal comparison and control in clinical research program: Secondary | ICD-10-CM | POA: Diagnosis not present

## 2022-05-09 DIAGNOSIS — Z8553 Personal history of malignant neoplasm of renal pelvis: Secondary | ICD-10-CM | POA: Diagnosis not present

## 2022-05-09 DIAGNOSIS — E119 Type 2 diabetes mellitus without complications: Secondary | ICD-10-CM | POA: Diagnosis not present

## 2022-05-09 DIAGNOSIS — J95811 Postprocedural pneumothorax: Secondary | ICD-10-CM | POA: Diagnosis not present

## 2022-05-09 DIAGNOSIS — Z7984 Long term (current) use of oral hypoglycemic drugs: Secondary | ICD-10-CM | POA: Diagnosis not present

## 2022-05-09 DIAGNOSIS — R079 Chest pain, unspecified: Secondary | ICD-10-CM | POA: Diagnosis not present

## 2022-05-09 DIAGNOSIS — R9431 Abnormal electrocardiogram [ECG] [EKG]: Secondary | ICD-10-CM | POA: Diagnosis not present

## 2022-05-09 DIAGNOSIS — Z8551 Personal history of malignant neoplasm of bladder: Secondary | ICD-10-CM | POA: Diagnosis not present

## 2022-05-09 DIAGNOSIS — Z86010 Personal history of colonic polyps: Secondary | ICD-10-CM | POA: Diagnosis not present

## 2022-05-09 DIAGNOSIS — J939 Pneumothorax, unspecified: Secondary | ICD-10-CM | POA: Diagnosis not present

## 2022-05-09 DIAGNOSIS — M7989 Other specified soft tissue disorders: Secondary | ICD-10-CM | POA: Diagnosis not present

## 2022-05-09 DIAGNOSIS — I129 Hypertensive chronic kidney disease with stage 1 through stage 4 chronic kidney disease, or unspecified chronic kidney disease: Secondary | ICD-10-CM | POA: Diagnosis not present

## 2022-05-09 DIAGNOSIS — I25119 Atherosclerotic heart disease of native coronary artery with unspecified angina pectoris: Secondary | ICD-10-CM | POA: Diagnosis not present

## 2022-05-09 DIAGNOSIS — Z48812 Encounter for surgical aftercare following surgery on the circulatory system: Secondary | ICD-10-CM | POA: Diagnosis not present

## 2022-05-09 DIAGNOSIS — Z4682 Encounter for fitting and adjustment of non-vascular catheter: Secondary | ICD-10-CM | POA: Diagnosis not present

## 2022-05-09 DIAGNOSIS — C641 Malignant neoplasm of right kidney, except renal pelvis: Secondary | ICD-10-CM | POA: Diagnosis not present

## 2022-05-09 DIAGNOSIS — N182 Chronic kidney disease, stage 2 (mild): Secondary | ICD-10-CM | POA: Diagnosis not present

## 2022-05-09 DIAGNOSIS — Z951 Presence of aortocoronary bypass graft: Secondary | ICD-10-CM | POA: Diagnosis not present

## 2022-05-09 DIAGNOSIS — I2510 Atherosclerotic heart disease of native coronary artery without angina pectoris: Secondary | ICD-10-CM | POA: Diagnosis not present

## 2022-05-09 DIAGNOSIS — Z905 Acquired absence of kidney: Secondary | ICD-10-CM | POA: Diagnosis not present

## 2022-05-09 DIAGNOSIS — F1020 Alcohol dependence, uncomplicated: Secondary | ICD-10-CM | POA: Diagnosis not present

## 2022-05-09 DIAGNOSIS — I25118 Atherosclerotic heart disease of native coronary artery with other forms of angina pectoris: Secondary | ICD-10-CM | POA: Diagnosis not present

## 2022-05-09 DIAGNOSIS — J9383 Other pneumothorax: Secondary | ICD-10-CM | POA: Diagnosis not present

## 2022-05-09 DIAGNOSIS — F17210 Nicotine dependence, cigarettes, uncomplicated: Secondary | ICD-10-CM | POA: Diagnosis not present

## 2022-05-09 DIAGNOSIS — I872 Venous insufficiency (chronic) (peripheral): Secondary | ICD-10-CM | POA: Diagnosis not present

## 2022-05-09 DIAGNOSIS — Z720 Tobacco use: Secondary | ICD-10-CM | POA: Diagnosis not present

## 2022-05-09 DIAGNOSIS — J90 Pleural effusion, not elsewhere classified: Secondary | ICD-10-CM | POA: Diagnosis not present

## 2022-05-09 DIAGNOSIS — I25112 Atherosclerotic heart disease of native coronary artery with refractory angina pectoris: Secondary | ICD-10-CM | POA: Diagnosis not present

## 2022-05-09 DIAGNOSIS — Z2821 Immunization not carried out because of patient refusal: Secondary | ICD-10-CM | POA: Diagnosis not present

## 2022-05-09 DIAGNOSIS — C679 Malignant neoplasm of bladder, unspecified: Secondary | ICD-10-CM | POA: Diagnosis not present

## 2022-05-09 DIAGNOSIS — I10 Essential (primary) hypertension: Secondary | ICD-10-CM | POA: Diagnosis not present

## 2022-05-24 DIAGNOSIS — Z720 Tobacco use: Secondary | ICD-10-CM | POA: Diagnosis not present

## 2022-05-24 DIAGNOSIS — I25119 Atherosclerotic heart disease of native coronary artery with unspecified angina pectoris: Secondary | ICD-10-CM | POA: Diagnosis not present

## 2022-05-24 DIAGNOSIS — I10 Essential (primary) hypertension: Secondary | ICD-10-CM | POA: Diagnosis not present

## 2022-05-24 DIAGNOSIS — F1010 Alcohol abuse, uncomplicated: Secondary | ICD-10-CM | POA: Diagnosis not present

## 2022-05-24 DIAGNOSIS — J939 Pneumothorax, unspecified: Secondary | ICD-10-CM | POA: Diagnosis not present

## 2022-05-24 DIAGNOSIS — E119 Type 2 diabetes mellitus without complications: Secondary | ICD-10-CM | POA: Diagnosis not present

## 2022-06-08 ENCOUNTER — Ambulatory Visit: Payer: PPO | Attending: Cardiovascular Disease | Admitting: Cardiovascular Disease

## 2022-06-08 ENCOUNTER — Encounter: Payer: Self-pay | Admitting: Cardiovascular Disease

## 2022-06-08 VITALS — BP 122/76 | HR 90 | Ht 70.0 in | Wt 171.4 lb

## 2022-06-08 DIAGNOSIS — Z720 Tobacco use: Secondary | ICD-10-CM

## 2022-06-08 DIAGNOSIS — I2510 Atherosclerotic heart disease of native coronary artery without angina pectoris: Secondary | ICD-10-CM | POA: Diagnosis not present

## 2022-06-08 DIAGNOSIS — E118 Type 2 diabetes mellitus with unspecified complications: Secondary | ICD-10-CM | POA: Diagnosis not present

## 2022-06-08 DIAGNOSIS — Z951 Presence of aortocoronary bypass graft: Secondary | ICD-10-CM

## 2022-06-08 DIAGNOSIS — Z8551 Personal history of malignant neoplasm of bladder: Secondary | ICD-10-CM | POA: Diagnosis not present

## 2022-06-08 DIAGNOSIS — Z79899 Other long term (current) drug therapy: Secondary | ICD-10-CM

## 2022-06-08 DIAGNOSIS — Z85528 Personal history of other malignant neoplasm of kidney: Secondary | ICD-10-CM | POA: Diagnosis not present

## 2022-06-08 MED ORDER — METOPROLOL SUCCINATE ER 25 MG PO TB24: 25.0000 mg | ORAL_TABLET | Freq: Every day | ORAL | 3 refills | Status: DC

## 2022-06-08 NOTE — Progress Notes (Signed)
Cardiology Office Note    Date:  06/15/2022   ID:  Grant Watkins, DOB November 12, 1955, MRN [id number]  PCP:  Patient, No Pcp Per  Cardiologist:  Shelva Majestic, MD   New cardiology evaluation referred through the courtesy of Dr. Myriam Jacobson following undergoing CABG revascularization surgery while visiting in [place].   History of Present Illness:  Grant Watkins is a 67 y.o. male who is followed by Dr. Harrington Challenger at [place] for primary care.  He has a history of hypertension,  hyperlipidemia, diabetes mellitus longstanding tobacco abuse, as well as a history of bladder CA with transurethral resections and renal cell carcinoma with right nephro ureterectomy in May 2012 followed by Dr. Alinda Money.  He recently went to [place], [place] with his wife to visit their daughter.  On the first night, he was awakened from sleep in the morning with 8 out of 10 chest pain radiating to his upper extremities.  He presented to the local hospital.  Troponins were negative.  He underwent a nuclear stress test which revealed ischemia in the inferior lateral wall on May 04, 2022.  A 2D echo Doppler study showed normal LV function with EF 65 to 69%.  Right ventricle was normal.  Aortic valve was trileaflet.  There was mild anterior posterior mitral leaflet thickening.  He was subsequently transferred to another hospital to undergo cardiac catheterization on May 07, 2022.  Catheterization revealed 70 stent stenosis of the ostial LAD, 70 to 80% eccentric hazy stenosis in the proximal LAD with mild diffuse disease of an inferior subbranch of the OM vessel.  He was again transferred to the main [facility] hospital and underwent CABG revascularization surgery x 1 with a LIMA to the LAD by Dr. Jewel Baize on May 11, 2022.  His course was complicated by left-sided pneumothorax and he underwent pigtail placement on February 27 with resolution.  He was on a medical regimen of aspirin 81 mg, atorvastatin 40 mg, metformin 1000 mg,  metoprolol tartrate 12.5 mg twice daily, and was on amiodarone which was discontinued.  He was discharged from the hospital and was evaluated by Dr. Moishe Spice at [facility] on May 24, 2022 for post operative initial assessment.  He was felt to be stable to return back to [place].  He sees Dr. Myriam Jacobson at [facility] for primary care and is now referred for cardiology evaluation and follow-up.  Since his CABG surgery, he feels well.  He has a longstanding history of tobacco use having started smoking at age 19.  He has smoked since his CABG surgery.  His current medications now include aspirin 325 mg, atorvastatin 40 mg, metformin 500 mg, and metoprolol tartrate 12.5 mg twice a day.  He presents for his initial cardiology evaluation.   Past Medical History:  Diagnosis Date   Adopted    Bladder cancer [facility])    History of bladder cancer 05/30/2017   History of kidney cancer 05/30/2017   Hypertension    Kidney carcinoma [facility])     Past Surgical History:  Procedure Laterality Date   BLADDER SURGERY     x 6   CYSTOSCOPY     7 or 8  surgeries   CYSTOSCOPY W/ RETROGRADES Left 09/29/2012   Procedure: CYSTOSCOPY WITH LEFT  RETROGRADE PYELOGRAM;  Surgeon: Dutch Gray, MD;  Location: [facility];  Service: Urology;  Laterality: Left;   CYSTOSCOPY WITH BIOPSY N/A 09/29/2012   Procedure: CYSTOSCOPY WITH BIOPSY;  Surgeon:  Dutch Gray, MD;  Location: [facility];  Service: Urology;  Laterality: N/A;  BLADDER BIOPSIES     INGUINAL HERNIA REPAIR Bilateral    as child   NEPHRECTOMY Right     Current Medications: Outpatient Medications Prior to Visit  Medication Sig Dispense Refill   aspirin 325 MG tablet Take 650 mg by mouth daily.     atorvastatin (LIPITOR) 40 MG tablet Take 40 mg by mouth daily.     metFORMIN (GLUCOPHAGE-XR) 500 MG 24 hr tablet Take 500 mg by mouth daily with breakfast.     metoprolol tartrate (LOPRESSOR) 25 MG tablet Take 12.5 mg by mouth 2 (two)  times daily.     Facility-Administered Medications Prior to Visit  Medication Dose Route Frequency Provider Last Rate Last Admin   0.9 %  sodium chloride infusion  500 mL Intravenous Once Irene Shipper, MD         Allergies:   Patient has no known allergies.   Social History   Socioeconomic History   Marital status: Married    Spouse name: Not on file   Number of children: 2   Years of education: Not on file   Highest education level: Not on file  Occupational History   Occupation: Graphics  Tobacco Use   Smoking status: Every Day    Packs/day: 0.50    Years: 40.00    Additional pack years: 0.00    Total pack years: 20.00    Types: Cigarettes   Smokeless tobacco: Never  Substance and Sexual Activity   Alcohol use: Yes    Comment: occassionally 1-2 per day   Drug use: No   Sexual activity: Not on file  Other Topics Concern   Not on file  Social History Narrative   Not on file   Social Determinants of Health   Financial Resource Strain: Not on file  Food Insecurity: Not on file  Transportation Needs: Not on file  Physical Activity: Not on file  Stress: Not on file  Social Connections: Not on file    He is married for 47 years.  He has 2 children and 4 grandchildren.  He is retired and previously worked for [employer].  He has been smoking cigarettes half pack a day for approximately 50 years.  He drinks alcohol and enjoys rum.  He plays golf and tennis typically 2 days/week.  Family History:  The patient's family history is not on file. He was adopted.  His adoptive mother is deceased at age 49.  He has a stepbrother age 48 and a 4 age 75.  ROS General: Negative; No fevers, chills, or night sweats;  HEENT: Negative; No changes in vision or hearing, sinus congestion, difficulty swallowing Pulmonary: Negative; No cough, wheezing, shortness of breath, hemoptysis Cardiovascular: See HPI GI: Negative; No nausea, vomiting, diarrhea, or abdominal  pain GU: Negative; No dysuria, hematuria, or difficulty voiding Musculoskeletal: Negative; no myalgias, joint pain, or weakness Hematologic/Oncology: Negative; no easy bruising, bleeding Endocrine: Negative; no heat/cold intolerance; no diabetes Neuro: Negative; no changes in balance, headaches Skin: Negative; No rashes or skin lesions Psychiatric: Negative; No behavioral problems, depression Sleep: Negative; No snoring, daytime sleepiness, hypersomnolence, bruxism, restless legs, hypnogognic hallucinations, no cataplexy Other comprehensive 14 point system review is negative.   PHYSICAL EXAM:   VS:  BP 122/76 (BP Location: Left Arm, Patient Position: Sitting, Cuff Size: Normal)   Pulse 90   Ht 5\' 10"  (1.778 m)   Wt 171 lb 6.4 oz (77.7 kg)  SpO2 95%   BMI 24.59 kg/m     Repeat blood pressure by me was 120/76  Wt Readings from Last 3 Encounters:  06/08/22 171 lb 6.4 oz (77.7 kg)  05/08/17 178 lb (80.7 kg)  04/17/17 178 lb 4 oz (80.9 kg)    General: Alert, oriented, no distress.  Skin: normal turgor, no rashes, warm and dry HEENT: Normocephalic, atraumatic. Pupils equal round and reactive to light; sclera anicteric; extraocular muscles intact; Nose without nasal septal hypertrophy Mouth/Parynx benign; Mallinpatti scale 3 Neck: No JVD, no carotid bruits; normal carotid upstroke Lungs: clear to ausculatation and percussion; no wheezing or rales Chest wall: without tenderness to palpitation Heart: PMI not displaced, RRR, s1 s2 normal, 1/6 systolic murmur, no diastolic murmur, no rubs, gallops, thrills, or heaves Abdomen: soft, nontender; no hepatosplenomehaly, BS+; abdominal aorta nontender and not dilated by palpation. Back: no CVA tenderness Pulses 2+ Musculoskeletal: full range of motion, normal strength, no joint deformities Extremities: no clubbing cyanosis or edema, Homan's sign negative  Neurologic: grossly nonfocal; Cranial nerves grossly wnl Psychologic: Normal mood and  affect   Studies/Labs Reviewed:   June 08, 2022 ECG (independently read by me): NSR at 90, QS V1-2  Recent Labs:    Latest Ref Rng & Units 09/25/2012   11:20 AM 07/26/2010    5:05 AM 07/25/2010    4:59 AM  BMP  Glucose 70 - 99 mg/dL 128  162  177   BUN 6 - 23 mg/dL 12  10  10    Creatinine 0.50 - 1.35 mg/dL 1.26  1.44  1.48   Sodium 135 - 145 mEq/L 137  134  134   Potassium 3.5 - 5.1 mEq/L 4.0  3.8 DELTA CHECK NOTED  4.6   Chloride 96 - 112 mEq/L 103  100  101   CO2 19 - 32 mEq/L 26  26  26    Calcium 8.4 - 10.5 mg/dL 9.2  8.3  8.3          No data to display             Latest Ref Rng & Units 09/25/2012   11:20 AM 07/26/2010    5:05 AM 07/25/2010    4:59 AM  CBC  WBC 4.0 - 10.5 K/uL 8.3     Hemoglobin 13.0 - 17.0 g/dL 15.3  12.3  13.2   Hematocrit 39.0 - 52.0 % 44.4  37.3  39.2   Platelets 150 - 400 K/uL 189      Lab Results  Component Value Date   MCV 91.5 09/25/2012   MCV 90.7 07/17/2010   No results found for: "TSH" No results found for: "HGBA1C"   BNP No results found for: "BNP"  ProBNP No results found for: "PROBNP"   Lipid Panel  No results found for: "CHOL", "TRIG", "HDL", "CHOLHDL", "VLDL", "LDLCALC", "LDLDIRECT", "LABVLDL"   RADIOLOGY: No results found.   Additional studies/ records that were reviewed today include:   I reviewed the extensive records [facility].   ASSESSMENT:    1. CAD in native artery   2. Hx of CABG: May 11, 2022   3. Type 2 diabetes mellitus with complication, without long-term current use of insulin ([place])   4. Tobacco abuse   5. History of bladder cancer   6. History of kidney cancer   7. Medication management     PLAN:  Mr. Eshbaugh is a 67 year old gentleman who has a 50-year history of tobacco use, follows a history of hypertension, hyperlipidemia,  diabetes mellitus, as well as remote bladder CA requiring transurethral resections and renal cell carcinoma with right nephro ureterectomy.  He was unaware  of any known CAD.  He recently went to [facility] to visit his daughter and on the first evening, he was awakening in the morning with chest pain concerning for unstable angina.  Troponins were negative.  An echo Doppler study showed normal LV function.  A nuclear perfusion study revealed ischemia in the inferior lateral wall.  He was found to have high-grade ostial and proximal LAD disease in all typically underwent CABG revascularization surgery on May 11, 2022 with a LIMA to his LAD.  His postoperative course was complicated by pneumothorax.  Initially postoperatively he was on amiodarone which was discontinued.  Presently, he remains symptom-free and is on a regimen of full dose aspirin 325 mg following his CABG surgery, metoprolol tartrate 12.5 mg twice a day, metformin 500 mg daily in addition to atorvastatin 40 mg.  Previously, he had been on losartan.  I had a long discussion with him today in the office.  I reviewed his hospitalization findings.  He has resumed smoking since his surgery although less that he had prior and I had a strong discussion with him today regarding the absolute cessation of continued tobacco use.  I am recommending follow-up laboratory be obtained and we will check a comprehensive metabolic panel, CBC, TSH, fasting lipid studies as well as LP(a).  His ECG today shows normal sinus rhythm at 90 with QS complex V1 and V2.  I have recommended he discontinue metoprolol tartrate and in its place we will initiate metoprolol succinate 25 mg daily.  This can be further titrated depending upon blood pressure and heart rate response.  I have recommended enrollment in phase 2 cardiac rehab following his CABG revascularization surgery.  I am recommending he undergo an echo Doppler study in 3 months for reassessment.  I will see him in 3 to 4 months for follow-up evaluation.  At that time I will decrease his aspirin to 81 mg.   Medication Adjustments/Labs and Tests Ordered: Current  medicines are reviewed at length with the patient today.  Concerns regarding medicines are outlined above.  Medication changes, Labs and Tests ordered today are listed in the Patient Instructions below. Patient Instructions  Medication Instructions:  STOP metoprolol tartrate (Lopressor) START metoprolol succinate (Toprol XL) 25 mg daily  *If you need a refill on your cardiac medications before your next appointment, please call your pharmacy*   Lab Work: Please return for FASTING labs in 3 months (CMET, CBC, Lipid, TSH, LPa)  Our in office lab hours are Monday-Friday 8:00-4:00, closed for lunch 12:45-1:45 pm.  No appointment needed.  [facility] locations:   [place] ([place]) - [id number] [place] [address] [place] [address] [place] - [address] Oncologist)  Testing/Procedures: Your physician has requested that you have an echocardiogram in 3 MONTHS. Echocardiography is a painless test that uses sound waves to create images of your heart. It provides your doctor with information about the size and shape of your heart and how well your heart's chambers and  valves are working. This procedure takes approximately one hour. There are no restrictions for this procedure. Please do NOT wear cologne, perfume, aftershave, or lotions (deodorant is allowed). Please arrive 15 minutes prior to your appointment time.  Follow-Up: At [facility], you and your health needs are our priority.  As part of our continuing mission to provide you with exceptional heart care, we have created designated Provider Care Teams.  These Care Teams include your primary Cardiologist (physician) and Advanced Practice  Providers (APPs -  Physician Assistants and Nurse Practitioners) who all work together to provide you with the care you need, when you need it.  We recommend signing up for the patient portal called "MyChart".  Sign up information is provided on this After Visit Summary.  MyChart is used to connect with patients for Virtual Visits (Telemedicine).  Patients are able to view lab/test results, encounter notes, upcoming appointments, etc.  Non-urgent messages can be sent to your provider as well.   To learn more about what you can do with MyChart, go to [email or web address].    Your next appointment:   4 month(s)  Provider:   Dr. Dow Adolph have been referred to: Cardiac Rehab    Signed, Shelva Majestic, MD  06/15/2022 8:05 AM    [facility] [address], [place], [place], [place]  [address] Phone: [phone]

## 2022-06-08 NOTE — Patient Instructions (Signed)
Medication Instructions:  STOP metoprolol tartrate (Lopressor) START metoprolol succinate (Toprol XL) 25 mg daily  *If you need a refill on your cardiac medications before your next appointment, please call your pharmacy*   Lab Work: Please return for FASTING labs in 3 months (CMET, CBC, Lipid, TSH, LPa)  Our in office lab hours are Monday-Friday 8:00-4:00, closed for lunch 12:45-1:45 pm.  No appointment needed.  [facility] locations:   [place] ([place]) - [id number] [place] [address] [place] [address] [place] - [address] Oncologist)  Testing/Procedures: Your physician has requested that you have an echocardiogram in 3 MONTHS. Echocardiography is a painless test that uses sound waves to create images of your heart. It provides your doctor with information about the size and shape of your heart and how well your heart's chambers and valves are working. This procedure takes approximately one hour. There are no restrictions for this procedure. Please do NOT wear cologne, perfume, aftershave, or lotions (deodorant is allowed). Please arrive 15 minutes prior to your appointment time.  Follow-Up: At [facility], you and your health needs are our priority.  As part of our continuing mission to provide you with exceptional heart care, we have created designated Provider Care Teams.  These Care Teams include your primary Cardiologist (physician) and Advanced Practice Providers (APPs -  Physician Assistants and Nurse Practitioners) who all work together to provide you with the care you need, when you need it.  We recommend signing up for the patient portal called "MyChart".  Sign up information  is provided on this After Visit Summary.  MyChart is used to connect with patients for Virtual Visits (Telemedicine).  Patients are able to view lab/test results, encounter notes, upcoming appointments, etc.  Non-urgent messages can be sent to your provider as well.   To learn more about what you can do with MyChart, go to [email or web address].    Your next appointment:   4 month(s)  Provider:   Dr. Dow Adolph have been referred to: Cardiac Rehab

## 2022-06-15 ENCOUNTER — Encounter: Payer: Self-pay | Admitting: Cardiovascular Disease

## 2022-06-19 ENCOUNTER — Encounter (HOSPITAL_COMMUNITY): Payer: Self-pay

## 2022-06-20 DIAGNOSIS — E559 Vitamin D deficiency, unspecified: Secondary | ICD-10-CM | POA: Diagnosis not present

## 2022-06-20 DIAGNOSIS — E7800 Pure hypercholesterolemia, unspecified: Secondary | ICD-10-CM | POA: Diagnosis not present

## 2022-06-20 DIAGNOSIS — Z0000 Encounter for general adult medical examination without abnormal findings: Secondary | ICD-10-CM | POA: Diagnosis not present

## 2022-06-20 DIAGNOSIS — Z6824 Body mass index (BMI) 24.0-24.9, adult: Secondary | ICD-10-CM | POA: Diagnosis not present

## 2022-06-20 DIAGNOSIS — E039 Hypothyroidism, unspecified: Secondary | ICD-10-CM | POA: Diagnosis not present

## 2022-06-20 DIAGNOSIS — I10 Essential (primary) hypertension: Secondary | ICD-10-CM | POA: Diagnosis not present

## 2022-06-20 DIAGNOSIS — Z1159 Encounter for screening for other viral diseases: Secondary | ICD-10-CM | POA: Diagnosis not present

## 2022-06-20 DIAGNOSIS — R809 Proteinuria, unspecified: Secondary | ICD-10-CM | POA: Diagnosis not present

## 2022-06-20 DIAGNOSIS — E1122 Type 2 diabetes mellitus with diabetic chronic kidney disease: Secondary | ICD-10-CM | POA: Diagnosis not present

## 2022-06-25 ENCOUNTER — Encounter: Payer: Self-pay | Admitting: Cardiovascular Disease

## 2022-06-28 ENCOUNTER — Ambulatory Visit: Payer: PPO | Attending: Cardiology | Admitting: Cardiology

## 2022-06-28 ENCOUNTER — Telehealth: Payer: Self-pay | Admitting: Cardiovascular Disease

## 2022-06-28 ENCOUNTER — Encounter: Payer: Self-pay | Admitting: Cardiology

## 2022-06-28 VITALS — BP 150/100 | HR 87 | Ht 70.0 in | Wt 168.0 lb

## 2022-06-28 DIAGNOSIS — Z720 Tobacco use: Secondary | ICD-10-CM | POA: Diagnosis not present

## 2022-06-28 DIAGNOSIS — I10 Essential (primary) hypertension: Secondary | ICD-10-CM

## 2022-06-28 DIAGNOSIS — E7800 Pure hypercholesterolemia, unspecified: Secondary | ICD-10-CM | POA: Diagnosis not present

## 2022-06-28 DIAGNOSIS — I2510 Atherosclerotic heart disease of native coronary artery without angina pectoris: Secondary | ICD-10-CM | POA: Diagnosis not present

## 2022-06-28 MED ORDER — ATORVASTATIN CALCIUM 40 MG PO TABS: 40.0000 mg | ORAL_TABLET | Freq: Every day | ORAL | 0 refills | Status: DC

## 2022-06-28 MED ORDER — LOSARTAN POTASSIUM 50 MG PO TABS: 50.0000 mg | ORAL_TABLET | Freq: Every day | ORAL | 3 refills | Status: DC

## 2022-06-28 NOTE — Telephone Encounter (Signed)
Patient states this has been going about a week with BP 160's/ 100's or 150's/90's  HR in the 90's No symptoms of irregular HR or palpitations. Taking metoprolol Succinate 25 mg daily.  He states that  the bottom of his sternum swells up everyday  " when I go to bed it's flat and then when I wake up its flat, but throughout the day it swells. Feels like a "hard bulge".  It is raised  about a half an inch. Not painful, chest is a little sore from surgery nothing new. It is stationary does not move. It stays through the day Even when laid down for a nap, but diminishes during the night.  It appears about an hour after getting up in the morning. No nausea, vomiting, heartburn, SOB.  Face feels warm periodically during the day, no association when the "bulge"to sternum appears. Both the bulge to sternum and increased BP have been going for a week. Open Acute appt. Today with DOD and patient placed on scheule

## 2022-06-28 NOTE — Telephone Encounter (Signed)
Pt c/o BP issue: STAT if pt c/o blurred vision, one-sided weakness or slurred speech  1. What are your last 5 BP readings?  162/102 this morning Has taken it everyday - states "it has been in the 160's over 100 something".  2. Are you having any other symptoms (ex. Dizziness, headache, blurred vision, passed out)? States he "just feels warm", and the bottom of his sternum swells up everyday - when he goes to bed and then wakes up its flat but throughout the day it swells  3. What is your BP issue? BP has been higher than normal, states it has been going on for about a week. Pt is concerned since his bypass surgery was about 6 wks ago. He is wondering if he needs to come in so Dr. Tresa Endo can see him.

## 2022-06-28 NOTE — Patient Instructions (Signed)
Medication Instructions:   START LOSARTAN 50 MG ONCE DAILY  *If you need a refill on your cardiac medications before your next appointment, please call your pharmacy*   Lab Work:  Your physician recommends that you return for lab work in: ONE WEEK-DO NOT NEED TO FAST  If you have labs (blood work) drawn today and your tests are completely normal, you will receive your results only by: MyChart Message (if you have MyChart) OR A paper copy in the mail If you have any lab test that is abnormal or we need to change your treatment, we will call you to review the results.    Follow-Up: At [facility], you and your health needs are our priority.  As part of our continuing mission to provide you with exceptional heart care, we have created designated Provider Care Teams.  These Care Teams include your primary Cardiologist (physician) and Advanced Practice Providers (APPs -  Physician Assistants and Nurse Practitioners) who all work together to provide you with the care you need, when you need it.  We recommend signing up for the patient portal called "MyChart".  Sign up information is provided on this After Visit Summary.  MyChart is used to connect with patients for Virtual Visits (Telemedicine).  Patients are able to view lab/test results, encounter notes, upcoming appointments, etc.  Non-urgent messages can be sent to your provider as well.   To learn more about what you can do with MyChart, go to [email or web address].    Your next appointment:    AS SCHEDULED

## 2022-06-28 NOTE — Progress Notes (Signed)
HPI: Follow-up coronary artery disease.  Patient is followed by Dr. Tresa Endo.  Patient seen in [place] in February with complaints of chest pain.  A nuclear study showed ischemia in the inferolateral wall.  Echocardiogram showed normal LV function.  Cardiac catheterization ultimately performed and was found to have disease in the LAD.  He had a LIMA to the LAD May 11, 2022.  Course was complicated by left-sided pneumothorax.  He saw Dr. Tresa Endo March 2024 and was doing well.  He contacted the office with a "bulge in his chest".  He was added to my schedule today.  Patient denies dyspnea, chest pain, palpitations or syncope.  No pedal edema.  He states that he did have hypertension prior to his coronary artery bypass graft.  His blood pressure had improved but is now trending up and at home is in the 150/100 range.  He also felt there was a "knot" in his chest.  Current Outpatient Medications  Medication Sig Dispense Refill   aspirin 325 MG tablet Take 650 mg by mouth daily.     metFORMIN (GLUCOPHAGE-XR) 500 MG 24 hr tablet Take 500 mg by mouth daily with breakfast.     metoprolol succinate (TOPROL XL) 25 MG 24 hr tablet Take 1 tablet (25 mg total) by mouth daily. 90 tablet 3   atorvastatin (LIPITOR) 40 MG tablet Take 40 mg by mouth daily.     Current Facility-Administered Medications  Medication Dose Route Frequency Provider Last Rate Last Admin   0.9 %  sodium chloride infusion  500 mL Intravenous Once Hilarie Fredrickson, MD         Past Medical History:  Diagnosis Date   Adopted    Bladder cancer    History of bladder cancer 05/30/2017   History of kidney cancer 05/30/2017   Hypertension    Kidney carcinoma     Past Surgical History:  Procedure Laterality Date   BLADDER SURGERY     x 6   CYSTOSCOPY     7 or 8  surgeries   CYSTOSCOPY W/ RETROGRADES Left 09/29/2012   Procedure: CYSTOSCOPY WITH LEFT  RETROGRADE PYELOGRAM;  Surgeon: Crecencio Mc, MD;  Location: [facility];  Service: Urology;   Laterality: Left;   CYSTOSCOPY WITH BIOPSY N/A 09/29/2012   Procedure: CYSTOSCOPY WITH BIOPSY;  Surgeon: Crecencio Mc, MD;  Location: [facility];  Service: Urology;  Laterality: N/A;  BLADDER BIOPSIES     INGUINAL HERNIA REPAIR Bilateral    as child   NEPHRECTOMY Right     Social History   Socioeconomic History   Marital status: Married    Spouse name: Not on file   Number of children: 2   Years of education: Not on file   Highest education level: Not on file  Occupational History   Occupation: Graphics  Tobacco Use   Smoking status: Every Day    Packs/day: 0.50    Years: 40.00    Additional pack years: 0.00    Total pack years: 20.00    Types: Cigarettes   Smokeless tobacco: Never  Substance and Sexual Activity   Alcohol use: Yes    Comment: occassionally 1-2 per day   Drug use: No   Sexual activity: Not on file  Other Topics Concern   Not on file  Social History Narrative   Not on file   Social Determinants of Health   Financial Resource Strain: Not on file  Food Insecurity: Not on file  Transportation Needs: Not  on file  Physical Activity: Not on file  Stress: Not on file  Social Connections: Not on file  Intimate Partner Violence: Not on file    Family History  Adopted: Yes    ROS: no fevers or chills, productive cough, hemoptysis, dysphasia, odynophagia, melena, hematochezia, dysuria, hematuria, rash, seizure activity, orthopnea, PND, pedal edema, claudication. Remaining systems are negative.  Physical Exam: Well-developed well-nourished in no acute distress.  Skin is warm and dry.  HEENT is normal.  Neck is supple.  Chest is clear to auscultation with normal expansion.  Sternotomy is stable.  There is no evidence of infection.  I cannot palpate masses. Cardiovascular exam is regular rate and rhythm.  Abdominal exam nontender or distended. No masses palpated. Extremities show no edema. neuro grossly intact   A/P  1 coronary artery disease status post  coronary artery bypass graft-patient denies chest pain.  Continue medical therapy including aspirin and statin.  Patient was concerned about potential mass in the sternal area.  I did not palpate mass on examination and his sternum appears to be stable without evidence of infection.  Will follow.  2 hypertension-patient's blood pressure is elevated; it has trended up since his bypass surgery.  He was on losartan prior to his coronary artery bypass and graft and was well-controlled by his report.  We will resume 50 mg daily.  Check potassium and renal function in 1 week.  Follow blood pressure and advance regimen as needed.  3 hyperlipidemia-continue statin.  4 tobacco abuse-patient counseled on discontinuing.  Olga Millers, MD

## 2022-06-29 NOTE — Telephone Encounter (Signed)
Patient seen by DOD. See notes.  Thanks!

## 2022-07-02 NOTE — Telephone Encounter (Signed)
Seen by DOD

## 2022-07-10 DIAGNOSIS — I2510 Atherosclerotic heart disease of native coronary artery without angina pectoris: Secondary | ICD-10-CM | POA: Diagnosis not present

## 2022-07-11 DIAGNOSIS — L720 Epidermal cyst: Secondary | ICD-10-CM | POA: Diagnosis not present

## 2022-07-11 LAB — BASIC METABOLIC PANEL
BUN/Creatinine Ratio: 10 (ref 10–24)
BUN: 13 mg/dL (ref 8–27)
CO2: 22 mmol/L (ref 20–29)
Calcium: 9.6 mg/dL (ref 8.6–10.2)
Chloride: 101 mmol/L (ref 96–106)
Creatinine, Ser: 1.33 mg/dL — ABNORMAL HIGH (ref 0.76–1.27)
Glucose: 263 mg/dL — ABNORMAL HIGH (ref 70–99)
Potassium: 4.2 mmol/L (ref 3.5–5.2)
Sodium: 139 mmol/L (ref 134–144)
eGFR: 58 mL/min/{1.73_m2} — ABNORMAL LOW (ref >59)

## 2022-07-23 ENCOUNTER — Telehealth (HOSPITAL_COMMUNITY): Payer: Self-pay

## 2022-07-23 NOTE — Telephone Encounter (Signed)
No response from pt.  Closed referral  

## 2022-08-08 DIAGNOSIS — L720 Epidermal cyst: Secondary | ICD-10-CM | POA: Diagnosis not present

## 2022-08-21 ENCOUNTER — Ambulatory Visit (HOSPITAL_COMMUNITY): Payer: PPO | Attending: Cardiovascular Disease

## 2022-08-21 DIAGNOSIS — I2510 Atherosclerotic heart disease of native coronary artery without angina pectoris: Secondary | ICD-10-CM

## 2022-08-21 DIAGNOSIS — Z951 Presence of aortocoronary bypass graft: Secondary | ICD-10-CM | POA: Insufficient documentation

## 2022-08-21 LAB — ECHOCARDIOGRAM COMPLETE
Area-P 1/2: 5.27 cm2
S' Lateral: 2.9 cm

## 2022-09-27 ENCOUNTER — Ambulatory Visit: Payer: PPO | Admitting: Physician Assistant

## 2022-09-27 ENCOUNTER — Encounter: Payer: Self-pay | Admitting: Physician Assistant

## 2022-09-27 VITALS — BP 134/78 | HR 87 | Ht 70.0 in | Wt 164.2 lb

## 2022-09-27 DIAGNOSIS — E119 Type 2 diabetes mellitus without complications: Secondary | ICD-10-CM

## 2022-09-27 DIAGNOSIS — I25810 Atherosclerosis of coronary artery bypass graft(s) without angina pectoris: Secondary | ICD-10-CM | POA: Diagnosis not present

## 2022-09-27 DIAGNOSIS — I10 Essential (primary) hypertension: Secondary | ICD-10-CM

## 2022-09-27 DIAGNOSIS — E785 Hyperlipidemia, unspecified: Secondary | ICD-10-CM | POA: Diagnosis not present

## 2022-09-27 MED ORDER — METOPROLOL SUCCINATE ER 50 MG PO TB24: 50.0000 mg | ORAL_TABLET | Freq: Every day | ORAL | 3 refills | Status: DC

## 2022-09-27 NOTE — Patient Instructions (Signed)
Medication Instructions:  Your physician has recommended you make the following change in your medication:   INCREASE the Metoprolol to 50 mg taking 1 daily.  You can take 2 of the 25 mg tablets at 1 time to use them up. *If you need a refill on your cardiac medications before your next appointment, please call your pharmacy*   Lab Work: None ordered  If you have labs (blood work) drawn today and your tests are completely normal, you will receive your results only by: MyChart Message (if you have MyChart) OR A paper copy in the mail If you have any lab test that is abnormal or we need to change your treatment, we will call you to review the results.   Testing/Procedures: None ordered   Follow-Up: At [facility], you and your health needs are our priority.  As part of our continuing mission to provide you with exceptional heart care, we have created designated Provider Care Teams.  These Care Teams include your primary Cardiologist (physician) and Advanced Practice Providers (APPs -  Physician Assistants and Nurse Practitioners) who all work together to provide you with the care you need, when you need it.  We recommend signing up for the patient portal called "MyChart".  Sign up information is provided on this After Visit Summary.  MyChart is used to connect with patients for Virtual Visits (Telemedicine).  Patients are able to view lab/test results, encounter notes, upcoming appointments, etc.  Non-urgent messages can be sent to your provider as well.   To learn more about what you can do with MyChart, go to [email or web address].    Your next appointment:   5-6  month(s)  Provider:   Nicki Guadalajara, MD     Other Instructions

## 2022-09-27 NOTE — Progress Notes (Signed)
Cardiology Office Note:  .   Date:  09/27/2022  ID:  Grant Watkins, DOB April 13, 1955, MRN [id number] PCP: Daisy Floro, MD  [facility] Providers Cardiologist:  Nicki Guadalajara, MD     History of Present Illness: .   Grant Watkins is a 66 y.o. male with past medical history of CAD, hypertension, hyperlipidemia, DM2 and tobacco abuse.  Patient was seen [facility] in February with complaint of chest pain.  Myoview showed ischemia in the inferolateral wall.  Echocardiogram showed normal EF.  Subsequent cardiac catheterization showed disease in the LAD.  He underwent LIMA to LAD on 05/11/2022.  Hospital course complicated by left-sided pneumothorax.  He was most recently seen by Dr. Jens Som in April 2024 with complaint of a bulge in his chest.  No palpable mass was seen on physical exam.  Medical therapy was recommended.  Repeat echocardiogram obtained on 08/21/2022 showed EF 50 to 55%, no regional wall motion abnormality, trivial AI.  Patient presents today for follow-up along with wife.  He denies any recent exertional chest pain or shortness of breath.  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  I reviewed the recent echocardiogram with him.  He still have not gotten the lipid panel Dr. Tresa Endo ordered.  He wished to have the blood work obtained at his PCPs office.  Blood pressures remain elevated at home, I will increase metoprolol succinate to 50 mg daily.  He can follow-up with Dr. Tresa Endo in 5 to 57-month.  ROS:   He denies chest pain, palpitations, dyspnea, pnd, orthopnea, n, v, dizziness, syncope, edema, weight gain, or early satiety. All other systems reviewed and are otherwise negative except as noted above.    Studies Reviewed: .         Cardiac Studies & Procedures       ECHOCARDIOGRAM  ECHOCARDIOGRAM COMPLETE 08/21/2022  Narrative ECHOCARDIOGRAM REPORT    Patient Name:   Grant Watkins Date of Exam: 08/21/2022 Medical Rec #:   [id number]        Height:       70.0 in Accession #:    [id number]       Weight:       168.0 lb Date of Birth:  22-Dec-1955         BSA:          1.938 m Patient Age:    67 years         BP:           180/121 mmHg Patient Gender: M                HR:           84 bpm. Exam Location:  [address]  Procedure: 2D Echo, 3D Echo, Cardiac Doppler and Color Doppler  Indications:    I25.10 CAD  History:        Patient has no prior history of Echocardiogram examinations. Prior CABG; Risk Factors:Hypertension and HLD, Tobacco abuse.  Sonographer:    Clearence Ped RCS Referring Phys: [phone] THOMAS A KELLY  IMPRESSIONS   1. Left ventricular ejection fraction, by estimation, is 50 to 55%. The left ventricle has low normal function. The left ventricle has no regional wall motion abnormalities. There is mild left ventricular hypertrophy. Left ventricular diastolic parameters were normal. 2. Right ventricular systolic function is normal. The right ventricular size is normal. There is normal pulmonary artery systolic pressure. 3. No  evidence of mitral valve regurgitation. 4. The aortic valve is normal in structure. Aortic valve regurgitation is trivial. 5. The inferior vena cava is normal in size with greater than 50% respiratory variability, suggesting right atrial pressure of 3 mmHg.  FINDINGS Left Ventricle: Left ventricular ejection fraction, by estimation, is 50 to 55%. The left ventricle has low normal function. The left ventricle has no regional wall motion abnormalities. The left ventricular internal cavity size was normal in size. There is mild left ventricular hypertrophy. Abnormal (paradoxical) septal motion consistent with post-operative status. Left ventricular diastolic parameters were normal.  Right Ventricle: The right ventricular size is normal. Right ventricular systolic function is normal. There is normal pulmonary artery systolic pressure. The tricuspid regurgitant velocity is 2.73  m/s, and with an assumed right atrial pressure of 3 mmHg, the estimated right ventricular systolic pressure is 32.8 mmHg.  Left Atrium: Left atrial size was normal in size.  Right Atrium: Right atrial size was normal in size.  Pericardium: There is no evidence of pericardial effusion.  Mitral Valve: No evidence of mitral valve regurgitation.  Tricuspid Valve: Tricuspid valve regurgitation is mild.  Aortic Valve: The aortic valve is normal in structure. Aortic valve regurgitation is trivial.  Pulmonic Valve: Pulmonic valve regurgitation is not visualized.  Aorta: The aortic root and ascending aorta are structurally normal, with no evidence of dilitation.  Venous: The inferior vena cava is normal in size with greater than 50% respiratory variability, suggesting right atrial pressure of 3 mmHg.  IAS/Shunts: No atrial level shunt detected by color flow Doppler.   LEFT VENTRICLE PLAX 2D LVIDd:         4.10 cm   Diastology LVIDs:         2.90 cm   LV e' medial:    8.27 cm/s LV PW:         1.00 cm   LV E/e' medial:  10.9 LV IVS:        1.40 cm   LV e' lateral:   11.00 cm/s LVOT diam:     2.00 cm   LV E/e' lateral: 8.2 LV SV:         45 LV SV Index:   23 LVOT Area:     3.14 cm  3D Volume EF: 3D EF:        56 % LV EDV:       129 ml LV ESV:       57 ml LV SV:        72 ml  RIGHT VENTRICLE RV Basal diam:  3.60 cm RV S prime:     10.70 cm/s TAPSE (M-mode): 2.0 cm RVSP:           32.8 mmHg  LEFT ATRIUM             Index        RIGHT ATRIUM           Index LA diam:        3.70 cm 1.91 cm/m   RA Pressure: 3.00 mmHg LA Vol (A2C):   55.9 ml 28.84 ml/m  RA Area:     13.90 cm LA Vol (A4C):   43.5 ml 22.44 ml/m  RA Volume:   32.20 ml  16.61 ml/m LA Biplane Vol: 51.7 ml 26.67 ml/m AORTIC VALVE LVOT Vmax:   68.70 cm/s LVOT Vmean:  47.800 cm/s LVOT VTI:    0.144 m  AORTA Ao Root diam: 3.90 cm Ao Asc diam:  3.40  cm  MITRAL VALVE               TRICUSPID VALVE MV Area  (PHT):             TR Peak grad:   29.8 mmHg MV Decel Time:             TR Vmax:        273.00 cm/s MV E velocity: 90.10 cm/s  Estimated RAP:  3.00 mmHg MV A velocity: 81.80 cm/s  RVSP:           32.8 mmHg MV E/A ratio:  1.10 SHUNTS Systemic VTI:  0.14 m Systemic Diam: 2.00 cm  Carolan Clines Electronically signed by Carolan Clines Signature Date/Time: 08/21/2022/12:47:45 PM    Final             Risk Assessment/Calculations:             Physical Exam:   VS:  BP 134/78   Pulse 87   Ht 5\' 10"  (1.778 m)   Wt 164 lb 3.2 oz (74.5 kg)   SpO2 98%   BMI 23.56 kg/m    Wt Readings from Last 3 Encounters:  09/27/22 164 lb 3.2 oz (74.5 kg)  06/28/22 168 lb (76.2 kg)  06/08/22 171 lb 6.4 oz (77.7 kg)    GEN: Well nourished, well developed in no acute distress NECK: No JVD; No carotid bruits CARDIAC: RRR, no murmurs, rubs, gallops RESPIRATORY:  Clear to auscultation without rales, wheezing or rhonchi  ABDOMEN: Soft, non-tender, non-distended EXTREMITIES:  No edema; No deformity   ASSESSMENT AND PLAN: .    CAD: Denies any chest pain.  On aspirin and Lipitor  Hypertension: Blood pressure elevated, will increase metoprolol succinate to 50 mg daily  Hyperlipidemia: On Lipitor 40 mg daily.  He wished to have annual lipid panel obtained at PCPs visit  DM2: On metformin.       Dispo: Follow-up with Dr. Tresa Endo in 6 months.  Signed, Azalee Course, PA

## 2022-12-07 DIAGNOSIS — R300 Dysuria: Secondary | ICD-10-CM | POA: Diagnosis not present

## 2022-12-07 DIAGNOSIS — N3000 Acute cystitis without hematuria: Secondary | ICD-10-CM | POA: Diagnosis not present

## 2022-12-07 DIAGNOSIS — C678 Malignant neoplasm of overlapping sites of bladder: Secondary | ICD-10-CM | POA: Diagnosis not present

## 2022-12-07 DIAGNOSIS — R310 Gross hematuria: Secondary | ICD-10-CM | POA: Diagnosis not present

## 2022-12-20 DIAGNOSIS — F17210 Nicotine dependence, cigarettes, uncomplicated: Secondary | ICD-10-CM | POA: Diagnosis not present

## 2022-12-20 DIAGNOSIS — E1122 Type 2 diabetes mellitus with diabetic chronic kidney disease: Secondary | ICD-10-CM | POA: Diagnosis not present

## 2022-12-20 DIAGNOSIS — E039 Hypothyroidism, unspecified: Secondary | ICD-10-CM | POA: Diagnosis not present

## 2022-12-20 DIAGNOSIS — Z6825 Body mass index (BMI) 25.0-25.9, adult: Secondary | ICD-10-CM | POA: Diagnosis not present

## 2022-12-20 DIAGNOSIS — I10 Essential (primary) hypertension: Secondary | ICD-10-CM | POA: Diagnosis not present

## 2023-02-21 ENCOUNTER — Ambulatory Visit: Payer: PPO | Attending: Physician Assistant | Admitting: Physician Assistant

## 2023-02-21 ENCOUNTER — Encounter: Payer: Self-pay | Admitting: Physician Assistant

## 2023-02-21 ENCOUNTER — Telehealth: Payer: Self-pay | Admitting: Cardiovascular Disease

## 2023-02-21 VITALS — BP 168/96 | HR 78 | Wt 169.6 lb

## 2023-02-21 DIAGNOSIS — I2510 Atherosclerotic heart disease of native coronary artery without angina pectoris: Secondary | ICD-10-CM | POA: Diagnosis not present

## 2023-02-21 DIAGNOSIS — I10 Essential (primary) hypertension: Secondary | ICD-10-CM

## 2023-02-21 MED ORDER — METOPROLOL SUCCINATE ER 50 MG PO TB24: 50.0000 mg | ORAL_TABLET | Freq: Every day | ORAL | 3 refills | Status: DC

## 2023-02-21 MED ORDER — IRBESARTAN 150 MG PO TABS: 150.0000 mg | ORAL_TABLET | Freq: Every day | ORAL | 5 refills | Status: DC

## 2023-02-21 MED ORDER — ATORVASTATIN CALCIUM 40 MG PO TABS: 40.0000 mg | ORAL_TABLET | Freq: Every day | ORAL | 3 refills | Status: DC

## 2023-02-21 NOTE — Telephone Encounter (Signed)
Will relay to the provider for review

## 2023-02-21 NOTE — Progress Notes (Signed)
Cardiology Office Note:  .   Date:  02/23/2023  ID:  Grant Watkins, DOB 07/26/1955, MRN [id number] PCP: Daisy Floro, MD  [facility] Providers Cardiologist:  Nicki Guadalajara, MD     History of Present Illness: .   Grant Watkins is a 67 y.o. male with past medical history of CAD, hypertension, hyperlipidemia, DM2 and tobacco abuse.  Patient was in [place] in February with complaint of chest pain.  Myoview showed ischemia in the inferolateral wall.  Echocardiogram showed normal EF.  Subsequent cardiac catheterization showed disease in the LAD.  He underwent LIMA to LAD on 05/11/2022.  Hospital course complicated by left-sided pneumothorax.  He was seen by Dr. Jens Som in April 2024 with complaint of a bulge in his chest.  No palpable mass was seen on physical exam.  Medical therapy was recommended.  Repeat echocardiogram obtained on 08/21/2022 showed EF 50 to 55%, no regional wall motion abnormality, trivial AI.   I last saw the patient in July 2024 at which time he was doing well without exertional chest pain or shortness of breath.  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  Blood pressure at the time was 134/78. The patient denied any chest pain during the consultation. An EKG showed minimal changes in two leads, but without corresponding changes in contralateral leads. The changes were deemed nonsignificant after comparison with a previous EKG after reviewing with DOD.   ROS:   He denies chest pain, palpitations, dyspnea, pnd, orthopnea, n, v, dizziness, syncope, edema, weight gain, or early satiety. All other systems reviewed and are otherwise negative except as noted above.    Studies Reviewed: Grant Watkins Kitchen   EKG Interpretation Date/Time:  Thursday February 21 2023 15:36:21 EST Ventricular Rate:  78 PR Interval:  152 QRS Duration:  72 QT Interval:  368 QTC Calculation: 419 R Axis:   56  Text Interpretation: Normal sinus rhythm Normal ECG  When compared with ECG of 25-Sep-2012 11:17, No significant change was found Confirmed by Azalee Course [phone]) on 02/23/2023 7:15:00 PM    Cardiac Studies & Procedures       ECHOCARDIOGRAM  ECHOCARDIOGRAM COMPLETE 08/21/2022  Narrative ECHOCARDIOGRAM REPORT    Patient Name:   Grant Watkins Date of Exam: 08/21/2022 Medical Rec #:  [id number]        Height:       70.0 in Accession #:    [id number]       Weight:       168.0 lb Date of Birth:  1955/07/17         BSA:          1.938 m Patient Age:    67 years         BP:           180/121 mmHg Patient Gender: M                HR:           84 bpm. Exam Location:  [address]  Procedure: 2D Echo, 3D Echo, Cardiac Doppler and Color Doppler  Indications:    I25.10 CAD  History:        Patient has no prior history of Echocardiogram examinations. Prior CABG; Risk Factors:Hypertension and HLD, Tobacco abuse.  Sonographer:    Clearence Ped RCS Referring Phys: [phone] THOMAS A KELLY  IMPRESSIONS   1. Left ventricular ejection fraction, by estimation, is 50 to 55%.  The left ventricle has low normal function. The left ventricle has no regional wall motion abnormalities. There is mild left ventricular hypertrophy. Left ventricular diastolic parameters were normal. 2. Right ventricular systolic function is normal. The right ventricular size is normal. There is normal pulmonary artery systolic pressure. 3. No evidence of mitral valve regurgitation. 4. The aortic valve is normal in structure. Aortic valve regurgitation is trivial. 5. The inferior vena cava is normal in size with greater than 50% respiratory variability, suggesting right atrial pressure of 3 mmHg.  FINDINGS Left Ventricle: Left ventricular ejection fraction, by estimation, is 50 to 55%. The left ventricle has low normal function. The left ventricle has no regional wall motion abnormalities. The left ventricular internal cavity size was normal in size. There is mild left ventricular  hypertrophy. Abnormal (paradoxical) septal motion consistent with post-operative status. Left ventricular diastolic parameters were normal.  Right Ventricle: The right ventricular size is normal. Right ventricular systolic function is normal. There is normal pulmonary artery systolic pressure. The tricuspid regurgitant velocity is 2.73 m/s, and with an assumed right atrial pressure of 3 mmHg, the estimated right ventricular systolic pressure is 32.8 mmHg.  Left Atrium: Left atrial size was normal in size.  Right Atrium: Right atrial size was normal in size.  Pericardium: There is no evidence of pericardial effusion.  Mitral Valve: No evidence of mitral valve regurgitation.  Tricuspid Valve: Tricuspid valve regurgitation is mild.  Aortic Valve: The aortic valve is normal in structure. Aortic valve regurgitation is trivial.  Pulmonic Valve: Pulmonic valve regurgitation is not visualized.  Aorta: The aortic root and ascending aorta are structurally normal, with no evidence of dilitation.  Venous: The inferior vena cava is normal in size with greater than 50% respiratory variability, suggesting right atrial pressure of 3 mmHg.  IAS/Shunts: No atrial level shunt detected by color flow Doppler.   LEFT VENTRICLE PLAX 2D LVIDd:         4.10 cm   Diastology LVIDs:         2.90 cm   LV e' medial:    8.27 cm/s LV PW:         1.00 cm   LV E/e' medial:  10.9 LV IVS:        1.40 cm   LV e' lateral:   11.00 cm/s LVOT diam:     2.00 cm   LV E/e' lateral: 8.2 LV SV:         45 LV SV Index:   23 LVOT Area:     3.14 cm  3D Volume EF: 3D EF:        56 % LV EDV:       129 ml LV ESV:       57 ml LV SV:        72 ml  RIGHT VENTRICLE RV Basal diam:  3.60 cm RV S prime:     10.70 cm/s TAPSE (M-mode): 2.0 cm RVSP:           32.8 mmHg  LEFT ATRIUM             Index        RIGHT ATRIUM           Index LA diam:        3.70 cm 1.91 cm/m   RA Pressure: 3.00 mmHg LA Vol (A2C):   55.9 ml 28.84  ml/m  RA Area:     13.90 cm LA Vol (A4C):   43.5 ml  22.44 ml/m  RA Volume:   32.20 ml  16.61 ml/m LA Biplane Vol: 51.7 ml 26.67 ml/m AORTIC VALVE LVOT Vmax:   68.70 cm/s LVOT Vmean:  47.800 cm/s LVOT VTI:    0.144 m  AORTA Ao Root diam: 3.90 cm Ao Asc diam:  3.40 cm  MITRAL VALVE               TRICUSPID VALVE MV Area (PHT):             TR Peak grad:   29.8 mmHg MV Decel Time:             TR Vmax:        273.00 cm/s MV E velocity: 90.10 cm/s  Estimated RAP:  3.00 mmHg MV A velocity: 81.80 cm/s  RVSP:           32.8 mmHg MV E/A ratio:  1.10 SHUNTS Systemic VTI:  0.14 m Systemic Diam: 2.00 cm  Carolan Clines Electronically signed by Carolan Clines Signature Date/Time: 08/21/2022/12:47:45 PM    Final             Risk Assessment/Calculations:            Physical Exam:   VS:  BP (!) 168/96 (BP Location: Left Arm, Patient Position: Sitting, Cuff Size: Normal)   Pulse 78   Wt 169 lb 9.6 oz (76.9 kg)   SpO2 97%   BMI 24.34 kg/m    Wt Readings from Last 3 Encounters:  02/21/23 169 lb 9.6 oz (76.9 kg)  09/27/22 164 lb 3.2 oz (74.5 kg)  06/28/22 168 lb (76.2 kg)    GEN: Well nourished, well developed in no acute distress NECK: No JVD; No carotid bruits CARDIAC: RRR, no murmurs, rubs, gallops RESPIRATORY:  Clear to auscultation without rales, wheezing or rhonchi  ABDOMEN: Soft, non-tender, non-distended EXTREMITIES:  No edema; No deformity   ASSESSMENT AND PLAN: .    Coronary Artery Disease History of CABG, currently asymptomatic with no exertional chest pain. 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 EKG showed minor changes, but no corresponding changes in contralateral leads. No clinical concern. -Continue current management.  Hypertension Elevated blood pressure at 134/78. Discussed switching from Losartan to Irbesartan for better control. -Transition from Losartan to Irbesartan 150mg  daily. -Monitor blood pressure at home for two weeks. -If systolic blood  pressure consistently 135 or higher after two weeks, increase Irbesartan to 300mg  daily.  Hyperlipidemia: On Lipitor  DM2: Managed by primary care provider.        Dispo: Follow-up with Dr. Tresa Endo in 53-month, I will see the patient back in 1 month for blood pressure medication titration.  Signed, Azalee Course, PA

## 2023-02-21 NOTE — Telephone Encounter (Signed)
Spoke with pt, he reports elevated blood pressures for the last week or so. He notices in the afternoon his blood pressure will be 165/108. He denies any symptoms other than flushing. He takes losartan 50 mg and metoprolol succ 50 mg both in the morning. He reports no changes in diet or stress. Aware will forward for dr Tresa Endo to review.

## 2023-02-21 NOTE — Telephone Encounter (Signed)
Pt c/o BP issue: STAT if pt c/o blurred vision, one-sided weakness or slurred speech  1. What are your last 5 BP readings?  153/103 this morning  2. Are you having any other symptoms (ex. Dizziness, headache, blurred vision, passed out)?  Flushed skin   3. What is your BP issue?   BP has been elevated and normally rises more in the afternoon.

## 2023-02-21 NOTE — Patient Instructions (Signed)
Medication Instructions:  STOP LOSARTAN   START IRBESARTAN 150 MG DAILY *If you need a refill on your cardiac medications before your next appointment, please call your pharmacy*   Lab Work: NO LABS If you have labs (blood work) drawn today and your tests are completely normal, you will receive your results only by: MyChart Message (if you have MyChart) OR A paper copy in the mail If you have any lab test that is abnormal or we need to change your treatment, we will call you to review the results.   Testing/Procedures: NO TESTING   Follow-Up: At [facility], you and your health needs are our priority.  As part of our continuing mission to provide you with exceptional heart care, we have created designated Provider Care Teams.  These Care Teams include your primary Cardiologist (physician) and Advanced Practice Providers (APPs -  Physician Assistants and Nurse Practitioners) who all work together to provide you with the care you need, when you need it.  Your next appointment:   1 month(s)  Provider:   Azalee Course, PA-C    Then, Nicki Guadalajara, MD will plan to see you again in 5 month(s).   Other Instructions IF SYSTOLIC BLOOD PRESSURE IS PERSISTENTLY >135 AFTER 2 WEEKS INCREASE IRBESARTAN TO 300 MG DAILY.

## 2023-02-25 NOTE — Telephone Encounter (Signed)
Pt returning call

## 2023-02-25 NOTE — Telephone Encounter (Signed)
Consider changing losartan to 50 mg twice a day or 100 mg in the morning.  He currently is on metoprolol 50 mg daily.  I do not know his current heart rate.  Recommend follow-up bmet several weeks to make certain renal function remains normal.

## 2023-02-25 NOTE — Telephone Encounter (Signed)
Spoke with Grant Watkins, he is currently on irbesartan 150 mg once daily. He reports his heart rate runs in the 90's. Aware will make dr Tresa Endo aware.

## 2023-02-25 NOTE — Telephone Encounter (Signed)
Left message for pt to call.

## 2023-02-27 DIAGNOSIS — N402 Nodular prostate without lower urinary tract symptoms: Secondary | ICD-10-CM | POA: Diagnosis not present

## 2023-03-02 ENCOUNTER — Ambulatory Visit
Admission: EM | Admit: 2023-03-02 | Discharge: 2023-03-02 | Disposition: A | Discharge status: Home / Self Care | Payer: PPO

## 2023-03-02 ENCOUNTER — Other Ambulatory Visit: Payer: Self-pay

## 2023-03-02 ENCOUNTER — Encounter: Payer: Self-pay | Admitting: Emergency Medicine

## 2023-03-02 DIAGNOSIS — R040 Epistaxis: Secondary | ICD-10-CM

## 2023-03-02 NOTE — ED Triage Notes (Signed)
Pt states around 7am his nose started bleeding and states it continues to bleed intermittently since then. Denies recent sickness or history of epistaxis.

## 2023-03-02 NOTE — Discharge Instructions (Addendum)
Advised patient if nosebleed occurs again may use OTC Afrin spray with gauze to occlude bleeding.  Encouraged to increase daily water intake to 64 ounces per day.  Advised if symptoms worsen and/or unresolved please follow-up with PCP or here for further evaluation.

## 2023-03-02 NOTE — ED Provider Notes (Signed)
[facility]    CSN: [id number] Arrival date & time: 03/02/23  1326      History   Chief Complaint Chief Complaint  Patient presents with   Epistaxis    HPI Grant Watkins is a 67 y.o. male.   HPI 67 year old male presents with bilateral nosebleed that began earlier this morning around 7 AM.  PMH significant for bladder cancer, and HTN.  Patient is accompanied by his wife this afternoon.  Past Medical History:  Diagnosis Date   Adopted    Bladder cancer [facility])    History of bladder cancer 05/30/2017   History of kidney cancer 05/30/2017   Hypertension    Kidney carcinoma [facility])     Patient Active Problem List   Diagnosis Date Noted   History of kidney cancer 05/30/2017   History of bladder cancer 05/30/2017   Adopted    Rectal bleeding 04/17/2017   Change in bowel habits 04/17/2017    Past Surgical History:  Procedure Laterality Date   BLADDER SURGERY     x 6   CYSTOSCOPY     7 or 8  surgeries   CYSTOSCOPY W/ RETROGRADES Left 09/29/2012   Procedure: CYSTOSCOPY WITH LEFT  RETROGRADE PYELOGRAM;  Surgeon: Crecencio Mc, MD;  Location: [facility];  Service: Urology;  Laterality: Left;   CYSTOSCOPY WITH BIOPSY N/A 09/29/2012   Procedure: CYSTOSCOPY WITH BIOPSY;  Surgeon: Crecencio Mc, MD;  Location: [facility];  Service: Urology;  Laterality: N/A;  BLADDER BIOPSIES     INGUINAL HERNIA REPAIR Bilateral    as child   NEPHRECTOMY Right        Home Medications    Prior to Admission medications   Medication Sig Start Date End Date Taking? Authorizing Provider  valsartan (DIOVAN) 40 MG tablet Take 40 mg by mouth daily.   Yes [provider]  aspirin 325 MG tablet Take 650 mg by mouth daily.    [provider]  atorvastatin (LIPITOR) 40 MG tablet Take 1 tablet (40 mg total) by mouth daily. 02/21/23 03/23/23  Azalee Course, PA  irbesartan (AVAPRO) 150 MG tablet Take 1 tablet (150 mg total) by mouth daily. 02/21/23   Azalee Course, PA  metFORMIN (GLUCOPHAGE-XR)  500 MG 24 hr tablet Take 500 mg by mouth daily with breakfast.    [provider]  metoprolol succinate (TOPROL-XL) 50 MG 24 hr tablet Take 1 tablet (50 mg total) by mouth daily. Take with or immediately following a meal. 02/21/23 05/22/23  Azalee Course, PA    Family History Family History  Adopted: Yes    Social History Social History   Tobacco Use   Smoking status: Every Day    Current packs/day: 0.50    Average packs/day: 0.5 packs/day for 40.0 years (20.0 ttl pk-yrs)    Types: Cigarettes   Smokeless tobacco: Never  Substance Use Topics   Alcohol use: Yes    Comment: occassionally 1-2 per day   Drug use: No     Allergies   Patient has no known allergies.   Review of Systems Review of Systems   Physical Exam Triage Vital Signs ED Triage Vitals  Encounter Vitals Group     BP      Systolic BP Percentile      Diastolic BP Percentile      Pulse      Resp      Temp      Temp src      SpO2  Weight      Height      Head Circumference      Peak Flow      Pain Score      Pain Loc      Pain Education      Exclude from Growth Chart    No data found.  Updated Vital Signs BP (!) 172/110 (BP Location: Right Arm)   Pulse 88   Temp 98 F (36.7 C)   SpO2 95%      Physical Exam Vitals and nursing note reviewed.  Constitutional:      Appearance: Normal appearance. He is normal weight.  HENT:     Head: Normocephalic and atraumatic.     Mouth/Throat:     Mouth: Mucous membranes are moist.     Pharynx: Oropharynx is clear.  Eyes:     Extraocular Movements: Extraocular movements intact.     Conjunctiva/sclera: Conjunctivae normal.     Pupils: Pupils are equal, round, and reactive to light.  Cardiovascular:     Rate and Rhythm: Normal rate and regular rhythm.     Pulses: Normal pulses.     Heart sounds: Normal heart sounds.  Pulmonary:     Effort: Pulmonary effort is normal.     Breath sounds: Normal breath sounds. No wheezing, rhonchi or rales.   Musculoskeletal:        General: Normal range of motion.     Cervical back: Normal range of motion and neck supple.  Skin:    General: Skin is warm and dry.  Neurological:     General: No focal deficit present.     Mental Status: He is alert and oriented to person, place, and time. Mental status is at baseline.  Psychiatric:        Mood and Affect: Mood normal.        Behavior: Behavior normal.        Thought Content: Thought content normal.      UC Treatments / Results  Labs (all labs ordered are listed, but only abnormal results are displayed) Labs Reviewed - No data to display  EKG   Radiology No results found.  Procedures Procedures (including critical care time)  Medications Ordered in UC Medications - No data to display  Initial Impression / Assessment and Plan / UC Course  I have reviewed the triage vital signs and the nursing notes.  Pertinent labs & imaging results that were available during my care of the patient were reviewed by me and considered in my medical decision making (see chart for details).     MDM: 1.  Epistaxis-resolved on its own while here in exam room.  Patient reports taking blood pressure medication as directed. Advised patient if nosebleed occurs again may use OTC Afrin spray with gauze to occlude bleeding.  Encouraged to increase daily water intake to 64 ounces per day.  Advised if symptoms worsen and/or unresolved please follow-up with PCP or here for further evaluation. Final Clinical Impressions(s) / UC Diagnoses   Final diagnoses:  Epistaxis     Discharge Instructions      Advised patient if nosebleed occurs again may use OTC Afrin spray with gauze to occlude bleeding.  Encouraged to increase daily water intake to 64 ounces per day.  Advised if symptoms worsen and/or unresolved please follow-up with PCP or here for further evaluation.     ED Prescriptions   None    PDMP not reviewed this encounter.   Trevor Iha,  FNP  03/02/23 1444  

## 2023-03-02 NOTE — ED Notes (Signed)
States he took both BP meds this am. Saw cardilogy last week for hypertension

## 2023-03-06 DIAGNOSIS — N402 Nodular prostate without lower urinary tract symptoms: Secondary | ICD-10-CM | POA: Diagnosis not present

## 2023-03-06 DIAGNOSIS — Z8551 Personal history of malignant neoplasm of bladder: Secondary | ICD-10-CM | POA: Diagnosis not present

## 2023-04-05 ENCOUNTER — Encounter: Payer: Self-pay | Admitting: Physician Assistant

## 2023-04-05 ENCOUNTER — Ambulatory Visit: Payer: PPO | Attending: Physician Assistant | Admitting: Physician Assistant

## 2023-04-05 VITALS — BP 118/78 | HR 81 | Ht 70.0 in | Wt 171.4 lb

## 2023-04-05 DIAGNOSIS — E785 Hyperlipidemia, unspecified: Secondary | ICD-10-CM | POA: Diagnosis not present

## 2023-04-05 DIAGNOSIS — I25810 Atherosclerosis of coronary artery bypass graft(s) without angina pectoris: Secondary | ICD-10-CM

## 2023-04-05 DIAGNOSIS — I10 Essential (primary) hypertension: Secondary | ICD-10-CM

## 2023-04-05 MED ORDER — IRBESARTAN 300 MG PO TABS: 300.0000 mg | ORAL_TABLET | Freq: Every day | ORAL | 3 refills | Status: DC

## 2023-04-05 NOTE — Progress Notes (Signed)
Cardiology Office Note:  .   Date:  04/05/2023  ID:  Grant Watkins, DOB 1956-03-13, MRN [id number] PCP: Daisy Floro, MD  [facility] Providers Cardiologist:  Nicki Guadalajara, MD     History of Present Illness: .   Grant Watkins is a 68 y.o. male with past medical history of CAD, HTN, HLD, DM2 and tobacco abuse.  Patient was in [place] in February 2024 with complaint of chest pain.  Myoview showed ischemia in the inferolateral wall.  Echocardiogram showed normal EF.  Subsequent cardiac catheterization showed disease in the LAD.  He underwent LIMA to LAD on 05/11/2022.  Hospital course complicated by left-sided pneumothorax.  He was seen by Dr. Jens Som in April 2024 with complaint of a bulge in his chest.  No palpable mass was seen on physical exam.  Medical therapy was recommended.  Repeat echocardiogram obtained on 08/21/2022 showed EF 50 to 55%, no regional wall motion abnormality, trivial AI.   I last saw the patient in early December 2024 at which time his blood pressure was quite elevated.  I switched his losartan to irbesartan 150 mg daily.  I instructed the patient that if systolic blood pressure remained 135 or higher after 2 weeks, he is to increase irbesartan to 300 mg daily.  He presents today for follow-up.  After he increased irbesartan to 300 mg daily, blood pressure is much better controlled.  Blood pressure today was 118/78.  He denies any chest pain or shortness of breath.  He can follow-up with Dr. Tresa Endo in 5 to 35-month.  ROS:   He denies chest pain, palpitations, dyspnea, pnd, orthopnea, n, v, dizziness, syncope, edema, weight gain, or early satiety. All other systems reviewed and are otherwise negative except as noted above.    Studies Reviewed: .        Cardiac Studies & Procedures      ECHOCARDIOGRAM  ECHOCARDIOGRAM COMPLETE 08/21/2022  Narrative ECHOCARDIOGRAM REPORT    Patient Name:   Grant Watkins Date of Exam: 08/21/2022 Medical Rec #:   [id number]        Height:       70.0 in Accession #:    [id number]       Weight:       168.0 lb Date of Birth:  February 28, 1956         BSA:          1.938 m Patient Age:    67 years         BP:           180/121 mmHg Patient Gender: M                HR:           84 bpm. Exam Location:  [address]  Procedure: 2D Echo, 3D Echo, Cardiac Doppler and Color Doppler  Indications:    I25.10 CAD  History:        Patient has no prior history of Echocardiogram examinations. Prior CABG; Risk Factors:Hypertension and HLD, Tobacco abuse.  Sonographer:    Clearence Ped RCS Referring Phys: [phone] THOMAS A KELLY  IMPRESSIONS   1. Left ventricular ejection fraction, by estimation, is 50 to 55%. The left ventricle has low normal function. The left ventricle has no regional wall motion abnormalities. There is mild left ventricular hypertrophy. Left ventricular diastolic parameters were normal. 2. Right ventricular systolic function is normal. The right ventricular size is normal. There is normal pulmonary artery  systolic pressure. 3. No evidence of mitral valve regurgitation. 4. The aortic valve is normal in structure. Aortic valve regurgitation is trivial. 5. The inferior vena cava is normal in size with greater than 50% respiratory variability, suggesting right atrial pressure of 3 mmHg.  FINDINGS Left Ventricle: Left ventricular ejection fraction, by estimation, is 50 to 55%. The left ventricle has low normal function. The left ventricle has no regional wall motion abnormalities. The left ventricular internal cavity size was normal in size. There is mild left ventricular hypertrophy. Abnormal (paradoxical) septal motion consistent with post-operative status. Left ventricular diastolic parameters were normal.  Right Ventricle: The right ventricular size is normal. Right ventricular systolic function is normal. There is normal pulmonary artery systolic pressure. The tricuspid regurgitant velocity is 2.73  m/s, and with an assumed right atrial pressure of 3 mmHg, the estimated right ventricular systolic pressure is 32.8 mmHg.  Left Atrium: Left atrial size was normal in size.  Right Atrium: Right atrial size was normal in size.  Pericardium: There is no evidence of pericardial effusion.  Mitral Valve: No evidence of mitral valve regurgitation.  Tricuspid Valve: Tricuspid valve regurgitation is mild.  Aortic Valve: The aortic valve is normal in structure. Aortic valve regurgitation is trivial.  Pulmonic Valve: Pulmonic valve regurgitation is not visualized.  Aorta: The aortic root and ascending aorta are structurally normal, with no evidence of dilitation.  Venous: The inferior vena cava is normal in size with greater than 50% respiratory variability, suggesting right atrial pressure of 3 mmHg.  IAS/Shunts: No atrial level shunt detected by color flow Doppler.   LEFT VENTRICLE PLAX 2D LVIDd:         4.10 cm   Diastology LVIDs:         2.90 cm   LV e' medial:    8.27 cm/s LV PW:         1.00 cm   LV E/e' medial:  10.9 LV IVS:        1.40 cm   LV e' lateral:   11.00 cm/s LVOT diam:     2.00 cm   LV E/e' lateral: 8.2 LV SV:         45 LV SV Index:   23 LVOT Area:     3.14 cm  3D Volume EF: 3D EF:        56 % LV EDV:       129 ml LV ESV:       57 ml LV SV:        72 ml  RIGHT VENTRICLE RV Basal diam:  3.60 cm RV S prime:     10.70 cm/s TAPSE (M-mode): 2.0 cm RVSP:           32.8 mmHg  LEFT ATRIUM             Index        RIGHT ATRIUM           Index LA diam:        3.70 cm 1.91 cm/m   RA Pressure: 3.00 mmHg LA Vol (A2C):   55.9 ml 28.84 ml/m  RA Area:     13.90 cm LA Vol (A4C):   43.5 ml 22.44 ml/m  RA Volume:   32.20 ml  16.61 ml/m LA Biplane Vol: 51.7 ml 26.67 ml/m AORTIC VALVE LVOT Vmax:   68.70 cm/s LVOT Vmean:  47.800 cm/s LVOT VTI:    0.144 m  AORTA Ao Root diam: 3.90 cm Ao  Asc diam:  3.40 cm  MITRAL VALVE               TRICUSPID VALVE MV Area  (PHT):             TR Peak grad:   29.8 mmHg MV Decel Time:             TR Vmax:        273.00 cm/s MV E velocity: 90.10 cm/s  Estimated RAP:  3.00 mmHg MV A velocity: 81.80 cm/s  RVSP:           32.8 mmHg MV E/A ratio:  1.10 SHUNTS Systemic VTI:  0.14 m Systemic Diam: 2.00 cm  Grant Watkins Electronically signed by Grant Watkins Signature Date/Time: 08/21/2022/12:47:45 PM    Final             Risk Assessment/Calculations:             Physical Exam:   VS:  BP 118/78   Pulse 81   Ht 5\' 10"  (1.778 m)   Wt 171 lb 6.4 oz (77.7 kg)   SpO2 96%   BMI 24.59 kg/m    Wt Readings from Last 3 Encounters:  04/05/23 171 lb 6.4 oz (77.7 kg)  02/21/23 169 lb 9.6 oz (76.9 kg)  09/27/22 164 lb 3.2 oz (74.5 kg)    GEN: Well nourished, well developed in no acute distress NECK: No JVD; No carotid bruits CARDIAC: RRR, no murmurs, rubs, gallops RESPIRATORY:  Clear to auscultation without rales, wheezing or rhonchi  ABDOMEN: Soft, non-tender, non-distended EXTREMITIES:  No edema; No deformity   ASSESSMENT AND PLAN: .    CAD s/p CABG Persistent, stable chest pain in the mid-sternal region since CABG surgery. No change in frequency, duration, or intensity. No increase with exertion. -Monitor symptoms. -Patient is aware to contact cardiology service if increase in frequency, duration, intensity, or onset with minimal exertion  Hypertension Elevated blood pressure at last visit. Switched from Losartan to Irbesartan 150mg  daily. Patient self-increased dose to 300mg  daily due to persistently elevated blood pressure. -Continue Irbesartan 300mg  daily. -Change prescription to 90-day supply of 300mg  tablets.  Hyperlipidemia: Continue atorvastatin.        Dispo: Follow-up with Dr. Tresa Endo in 5 to 6 months.  Signed, Azalee Course, PA

## 2023-04-05 NOTE — Patient Instructions (Signed)
Medication Instructions:  NO CHANGES *If you need a refill on your cardiac medications before your next appointment, please call your pharmacy*   Lab Work: NO LABS If you have labs (blood work) drawn today and your tests are completely normal, you will receive your results only by: MyChart Message (if you have MyChart) OR A paper copy in the mail If you have any lab test that is abnormal or we need to change your treatment, we will call you to review the results.   Testing/Procedures: NO TESTING   Follow-Up: At [facility], you and your health needs are our priority.  As part of our continuing mission to provide you with exceptional heart care, we have created designated Provider Care Teams.  These Care Teams include your primary Cardiologist (physician) and Advanced Practice Providers (APPs -  Physician Assistants and Nurse Practitioners) who all work together to provide you with the care you need, when you need it.  Your next appointment:   5-6 month(s)  Provider:   Nicki Guadalajara, MD   Other Instructions

## 2023-06-05 DIAGNOSIS — Z8551 Personal history of malignant neoplasm of bladder: Secondary | ICD-10-CM | POA: Diagnosis not present

## 2023-06-05 DIAGNOSIS — R8289 Other abnormal findings on cytological and histological examination of urine: Secondary | ICD-10-CM | POA: Diagnosis not present

## 2023-06-17 ENCOUNTER — Other Ambulatory Visit: Payer: Self-pay | Admitting: Urology

## 2023-06-17 ENCOUNTER — Telehealth: Payer: Self-pay | Admitting: Cardiovascular Disease

## 2023-06-17 NOTE — Telephone Encounter (Signed)
   Pre-operative Risk Assessment    Patient Name: Grant Watkins  DOB: 04/30/1955 MRN: [id number]   Date of last office visit: 04/05/23 Date of next office visit: 6 Month F/U 07/25   Request for Surgical Clearance    Procedure:   Cystoscopy Bilateral Retrograde Pyelogram Transurethral Resection of bladder tumor  Date of Surgery:  Clearance 07/11/23                                Surgeon:  Dr. Heloise Purpura Surgeon's Group or Practice Name:  [facility] Phone number:  [phone] EXT: [phone] Fax number:  [phone]   Type of Clearance Requested:   - Medical  - Pharmacy:  Hold Aspirin     Type of Anesthesia:  General    Additional requests/questions:    SignedAllean Found   06/17/2023, 2:07 PM

## 2023-06-18 NOTE — Telephone Encounter (Signed)
 I s/w the pt about tele preop appt. Pt said he is probably due to come in for a f/u. He states that he still has the pain that comes across his chest, this is the same pain as from last year, but it still comes and goes and he would prefer in office appt. Pt states he has been seeing Azalee Course, PAC in the past. Pt has been scheduled to see Azalee Course, [facility] 06/19/23 3:35 at [facility] office. Pt thanked me for the help.   I will update all parties involved.

## 2023-06-18 NOTE — Telephone Encounter (Signed)
   Name: [name]  DOB: Jul 21, 1955  MRN: [id number]  Primary Cardiologist: Nicki Guadalajara, MD  Chart reviewed as part of pre-operative protocol coverage. Because of Grant Watkins's past medical history and time since last visit, he will require a follow-up telephone visit in order to better assess preoperative cardiovascular risk.  Pre-op covering staff: - Please schedule appointment and call patient to inform them. If patient already had an upcoming appointment within acceptable timeframe, please add "pre-op clearance" to the appointment notes so provider is aware. - Please contact requesting surgeon's office via preferred method (i.e, phone, fax) to inform them of need for appointment prior to surgery.   No medication indicated as needing held.   Sharlene Dory, PA-C  06/18/2023, 1:24 PM

## 2023-06-19 ENCOUNTER — Encounter: Payer: Self-pay | Admitting: Physician Assistant

## 2023-06-19 ENCOUNTER — Ambulatory Visit: Attending: Physician Assistant | Admitting: Physician Assistant

## 2023-06-19 VITALS — BP 163/95 | HR 91 | Ht 70.0 in | Wt 169.0 lb

## 2023-06-19 DIAGNOSIS — I10 Essential (primary) hypertension: Secondary | ICD-10-CM | POA: Diagnosis not present

## 2023-06-19 DIAGNOSIS — I25810 Atherosclerosis of coronary artery bypass graft(s) without angina pectoris: Secondary | ICD-10-CM | POA: Diagnosis not present

## 2023-06-19 DIAGNOSIS — Z01810 Encounter for preprocedural cardiovascular examination: Secondary | ICD-10-CM

## 2023-06-19 DIAGNOSIS — E785 Hyperlipidemia, unspecified: Secondary | ICD-10-CM

## 2023-06-19 MED ORDER — METOPROLOL SUCCINATE ER 100 MG PO TB24: 100.0000 mg | ORAL_TABLET | Freq: Every day | ORAL | 3 refills | Status: AC

## 2023-06-19 MED ORDER — AMLODIPINE BESYLATE 2.5 MG PO TABS
2.5000 mg | ORAL_TABLET | Freq: Every day | ORAL | 3 refills | Status: DC
Start: 2023-06-19 — End: 2023-10-29

## 2023-06-19 NOTE — Progress Notes (Unsigned)
 Cardiology Office Note:  .   Date:  06/20/2023  ID:  Grant Watkins, DOB 04/12/1955, MRN [id number] PCP: Daisy Floro, MD   [facility] Providers Cardiologist:  Nicki Guadalajara, MD   History of Present Illness: .   Grant Watkins is a 68 y.o. male with past medical history of CAD, HTN, HLD, DM2 and tobacco abuse.  Patient was in [place] in February 2024 with complaint of chest pain.  Myoview showed ischemia in the inferolateral wall.  Echocardiogram showed normal EF.  Subsequent cardiac catheterization showed disease in the LAD.  He underwent LIMA to LAD on 05/11/2022.  Hospital course complicated by left-sided pneumothorax.  He was seen by Dr. Jens Som in April 2024 with complaint of a bulge in his chest.  No palpable mass was seen on physical exam.  Medical therapy was recommended.  Repeat echocardiogram obtained on 08/21/2022 showed EF 50 to 55%, no regional wall motion abnormality, trivial AI.    I saw the patient in December 2024, his blood pressure was very elevated.  I switched him from losartan to irbesartan.  I last saw the patient in January 2025 at which time he was doing well his blood pressure was very well-controlled.  He is due for cystoscopy procedure with bilateral retrograde pyelogram and transurethral resection of the bladder tumor by Dr. Laverle Patter.   Patient presents today for follow-up.  He has chronic soreness in the chest that is constant since his bypass surgery last year.  This does not worsen with physical exertion.  He has no lower extremity edema, orthopnea or PND.  Blood pressure is elevated again.  I will increase his metoprolol succinate to 100 mg daily and add low-dose 2.5 mg amlodipine.  He has been instructed to contact cardiology service if systolic blood pressure remaining elevated greater than 150 mmHg after 2 weeks.  He can follow-up with Dr. Jens Som in 7-month since his primary cardiologist Dr. Tresa Endo is about to retire.  ROS:   Patient has  chronic chest soreness since bypass surgery.  Studies Reviewed: Marland Kitchen   EKG Interpretation Date/Time:  Wednesday June 19 2023 15:45:51 EDT Ventricular Rate:  90 PR Interval:  138 QRS Duration:  84 QT Interval:  368 QTC Calculation: 450 R Axis:   65  Text Interpretation: Normal sinus rhythm Possible Left atrial enlargement Nonspecific ST abnormality When compared with ECG of 21-Feb-2023 15:36, No significant change was found Confirmed by Azalee Course [phone]) on 06/20/2023 11:07:13 PM     Risk Assessment/Calculations:            Physical Exam:   VS:  BP (!) 163/95 (BP Location: Left Arm, Patient Position: Sitting, Cuff Size: Normal)   Pulse 91   Ht 5\' 10"  (1.778 m)   Wt 169 lb (76.7 kg)   SpO2 96%   BMI 24.25 kg/m    Wt Readings from Last 3 Encounters:  06/19/23 169 lb (76.7 kg)  04/05/23 171 lb 6.4 oz (77.7 kg)  02/21/23 169 lb 9.6 oz (76.9 kg)    GEN: Well nourished, well developed in no acute distress NECK: No JVD; No carotid bruits CARDIAC: RRR, no murmurs, rubs, gallops RESPIRATORY:  Clear to auscultation without rales, wheezing or rhonchi  ABDOMEN: Soft, non-tender, non-distended EXTREMITIES:  No edema; No deformity   ASSESSMENT AND PLAN: .    Coronary Artery Disease s/p LIMA-LAD Coronary artery disease with previous bypass surgery. Amlodipine added to improve coronary blood flow and manage hypertension. - Continue irbesartan. -Increase metoprolol  succinate - Add amlodipine to regimen.  Hypertension Blood pressure elevated at 166/100 mmHg. Previously controlled on irbesartan and metoprolol succinate. Requires management for surgical procedure. Urologists require blood pressure <160 mmHg. Increasing metoprolol expected to reduce BP by 5-7 mmHg. - Increase metoprolol succinate from 50 mg to 100 mg daily. - Add low-dose amlodipine to regimen. - Monitor blood pressure at home. Report if systolic >150-160 mmHg after two weeks. - Report if systolic BP <110 mmHg; consider  stopping amlodipine.  Hyperlipidemia: On atorvastatin  Surgical Clearance Scheduled for cystoscopy and transurethral resection of bladder tumor. Requires cardiac clearance. Blood pressure management crucial for clearance. - Provide cardiac clearance letter to urologist. - Schedule follow-up if BP remains elevated after two weeks of medication adjustment. - Ensure annual blood work by primary care physician, including cholesterol, basic metabolic panel, and CBC.       Dispo: Establish with Dr. Jens Som in 75-month after Dr. Rivka Barbara  Signed, Azalee Course, [place]

## 2023-06-19 NOTE — Patient Instructions (Signed)
 Medication Instructions:  INCREASE METOPROLOL SUCCINATE TO 100 MG DAILY  START AMLODIPINE 2.5 MG DAILY *If you need a refill on your cardiac medications before your next appointment, please call your pharmacy*  Lab Work: NO LABS If you have labs (blood work) drawn today and your tests are completely normal, you will receive your results only by: MyChart Message (if you have MyChart) OR A paper copy in the mail If you have any lab test that is abnormal or we need to change your treatment, we will call you to review the results.  Testing/Procedures: NO TESTING  Follow-Up: At [facility], you and your health needs are our priority.  As part of our continuing mission to provide you with exceptional heart care, our providers are all part of one team.  This team includes your primary Cardiologist (physician) and Advanced Practice Providers or APPs (Physician Assistants and Nurse Practitioners) who all work together to provide you with the care you need, when you need it.  Your next appointment:   4 month(s)  Provider:   Olga Millers, MD  Other Instructions MONITOR BLOOD PRESSURE IF SYSTOLIC IS 150 OR GREATER AFTER 2 WEEKS GIVE OFFICE A CALL  PROVIDER HAS CLEARED YOU FOR YOUR PROCEDURE AND WILL FAX OVER CLEARANCE LETTER TO SURGEON OFFICE    1st Floor: - Lobby - Registration  - Pharmacy  - Lab - Cafe  2nd Floor: - PV Lab - Diagnostic Testing (echo, CT, nuclear med)  3rd Floor: - Vacant  4th Floor: - TCTS (cardiothoracic surgery) - AFib Clinic - Structural Heart Clinic - Vascular Surgery  - Vascular Ultrasound  5th Floor: - HeartCare Cardiology (general and EP) - Clinical Pharmacy for coumadin, hypertension, lipid, weight-loss medications, and med management appointments    Valet parking services will be available as well.

## 2023-06-20 NOTE — Telephone Encounter (Signed)
   Patient Name: Grant Watkins  DOB: Nov 27, 1955 MRN: [id number]  Primary Cardiologist: Nicki Guadalajara, MD  Chart reviewed as part of pre-operative protocol coverage. Given past medical history and time since last visit, based on ACC/AHA guidelines, Grant Watkins is at acceptable risk for the planned procedure without further cardiovascular testing.   If needed, he may reduce aspirin to 81 mg prior to the surgery.  The patient was advised that if he develops new symptoms prior to surgery to contact our office to arrange for a follow-up visit, and he verbalized understanding.  I will route this recommendation to the requesting party via Epic fax function and remove from pre-op pool.  Please call with questions.  Home, [place] 06/20/2023, 11:10 PM

## 2023-07-02 DIAGNOSIS — N183 Chronic kidney disease, stage 3 unspecified: Secondary | ICD-10-CM | POA: Diagnosis not present

## 2023-07-02 DIAGNOSIS — E1122 Type 2 diabetes mellitus with diabetic chronic kidney disease: Secondary | ICD-10-CM | POA: Diagnosis not present

## 2023-07-02 DIAGNOSIS — E559 Vitamin D deficiency, unspecified: Secondary | ICD-10-CM | POA: Diagnosis not present

## 2023-07-02 DIAGNOSIS — I10 Essential (primary) hypertension: Secondary | ICD-10-CM | POA: Diagnosis not present

## 2023-07-02 DIAGNOSIS — E039 Hypothyroidism, unspecified: Secondary | ICD-10-CM | POA: Diagnosis not present

## 2023-07-02 DIAGNOSIS — E7800 Pure hypercholesterolemia, unspecified: Secondary | ICD-10-CM | POA: Diagnosis not present

## 2023-07-02 DIAGNOSIS — Z6824 Body mass index (BMI) 24.0-24.9, adult: Secondary | ICD-10-CM | POA: Diagnosis not present

## 2023-07-02 DIAGNOSIS — Z0000 Encounter for general adult medical examination without abnormal findings: Secondary | ICD-10-CM | POA: Diagnosis not present

## 2023-07-02 NOTE — Patient Instructions (Signed)
 DUE TO COVID-19 ONLY TWO VISITORS  (aged 69 and older)  ARE ALLOWED TO COME WITH YOU AND STAY IN THE WAITING ROOM ONLY DURING PRE OP AND PROCEDURE.   **NO VISITORS ARE ALLOWED IN THE SHORT STAY AREA OR RECOVERY ROOM!!**  IF YOU WILL BE ADMITTED INTO THE HOSPITAL YOU ARE ALLOWED ONLY FOUR SUPPORT PEOPLE DURING VISITATION HOURS ONLY (7 AM -8PM)   The support person(s) must pass our screening, gel in and out, and wear a mask at all times, including in the patient's room. Patients must also wear a mask when staff or their support person are in the room. Visitors GUEST BADGE MUST BE WORN VISIBLY  One adult visitor may remain with you overnight and MUST be in the room by 8 P.M.     Your procedure is scheduled on: 07/11/23   Report to [facility] Main Entrance    Report to admitting at : 9:40 AM   Call this number if you have problems the morning of surgery [phone]   Do not eat food or drink: After Midnight.  FOLLOW ANY ADDITIONAL PRE OP INSTRUCTIONS YOU RECEIVED FROM YOUR SURGEON'S OFFICE!!!   Oral Hygiene is also important to reduce your risk of infection.                                    Remember - BRUSH YOUR TEETH THE MORNING OF SURGERY WITH YOUR REGULAR TOOTHPASTE  DENTURES WILL BE REMOVED PRIOR TO SURGERY PLEASE DO NOT APPLY "Poly grip" OR ADHESIVES!!!   Do NOT smoke after Midnight   Take these medicines the morning of surgery with A SIP OF WATER: metoprolol,amlodipine  DO NOT TAKE ANY ORAL DIABETIC MEDICATIONS DAY OF YOUR SURGERY                              You may not have any metal on your body including hair pins, jewelry, and body piercing             Do not wear lotions, powders, perfumes/cologne, or deodorant              Men may shave face and neck.   Do not bring valuables to the hospital. [facility] IS NOT             RESPONSIBLE   FOR VALUABLES.   Contacts, glasses, or bridgework may not be worn into surgery.   Bring small overnight bag day of  surgery.   DO NOT BRING YOUR HOME MEDICATIONS TO THE HOSPITAL. PHARMACY WILL DISPENSE MEDICATIONS LISTED ON YOUR MEDICATION LIST TO YOU DURING YOUR ADMISSION IN THE HOSPITAL!    Patients discharged on the day of surgery will not be allowed to drive home.  Someone NEEDS to stay with you for the first 24 hours after anesthesia.   Special Instructions: Bring a copy of your healthcare power of attorney and living will documents         the day of surgery if you haven't scanned them before.              Please read over the following fact sheets you were given: IF YOU HAVE QUESTIONS ABOUT YOUR PRE-OP INSTRUCTIONS PLEASE CALL [phone]    [facility] - Preparing for Surgery Before surgery, you can play an important role.  Because skin is not sterile, your skin needs  to be as free of germs as possible.  You can reduce the number of germs on your skin by washing with CHG (chlorahexidine gluconate) soap before surgery.  CHG is an antiseptic cleaner which kills germs and bonds with the skin to continue killing germs even after washing. Please DO NOT use if you have an allergy to CHG or antibacterial soaps.  If your skin becomes reddened/irritated stop using the CHG and inform your nurse when you arrive at Short Stay. Do not shave (including legs and underarms) for at least 48 hours prior to the first CHG shower.  You may shave your face/neck. Please follow these instructions carefully:  1.  Shower with CHG Soap the night before surgery and the  morning of Surgery.  2.  If you choose to wash your hair, wash your hair first as usual with your  normal  shampoo.  3.  After you shampoo, rinse your hair and body thoroughly to remove the  shampoo.                           4.  Use CHG as you would any other liquid soap.  You can apply chg directly  to the skin and wash                       Gently with a scrungie or clean washcloth.  5.  Apply the CHG Soap to your body ONLY FROM THE NECK DOWN.   Do not use on  face/ open                           Wound or open sores. Avoid contact with eyes, ears mouth and genitals (private parts).                       Wash face,  Genitals (private parts) with your normal soap.             6.  Wash thoroughly, paying special attention to the area where your surgery  will be performed.  7.  Thoroughly rinse your body with warm water from the neck down.  8.  DO NOT shower/wash with your normal soap after using and rinsing off  the CHG Soap.                9.  Pat yourself dry with a clean towel.            10.  Wear clean pajamas.            11.  Place clean sheets on your bed the night of your first shower and do not  sleep with pets. Day of Surgery : Do not apply any lotions/deodorants the morning of surgery.  Please wear clean clothes to the hospital/surgery center.  FAILURE TO FOLLOW THESE INSTRUCTIONS MAY RESULT IN THE CANCELLATION OF YOUR SURGERY PATIENT SIGNATURE_________________________________  NURSE SIGNATURE__________________________________  ________________________________________________________________________

## 2023-07-03 ENCOUNTER — Encounter (HOSPITAL_COMMUNITY)
Admission: RE | Admit: 2023-07-03 | Discharge: 2023-07-03 | Disposition: A | Discharge status: Home / Self Care | Source: Ambulatory Visit | Attending: Urology | Admitting: Urology

## 2023-07-03 ENCOUNTER — Encounter (HOSPITAL_COMMUNITY): Payer: Self-pay

## 2023-07-03 ENCOUNTER — Other Ambulatory Visit: Payer: Self-pay

## 2023-07-03 VITALS — BP 150/92 | HR 87 | Temp 98.8°F | Ht 70.0 in | Wt 166.0 lb

## 2023-07-03 DIAGNOSIS — Z951 Presence of aortocoronary bypass graft: Secondary | ICD-10-CM | POA: Diagnosis not present

## 2023-07-03 DIAGNOSIS — F17210 Nicotine dependence, cigarettes, uncomplicated: Secondary | ICD-10-CM | POA: Diagnosis not present

## 2023-07-03 DIAGNOSIS — Z01818 Encounter for other preprocedural examination: Secondary | ICD-10-CM

## 2023-07-03 DIAGNOSIS — Z01812 Encounter for preprocedural laboratory examination: Secondary | ICD-10-CM | POA: Insufficient documentation

## 2023-07-03 DIAGNOSIS — I10 Essential (primary) hypertension: Secondary | ICD-10-CM | POA: Insufficient documentation

## 2023-07-03 DIAGNOSIS — I2510 Atherosclerotic heart disease of native coronary artery without angina pectoris: Secondary | ICD-10-CM | POA: Diagnosis not present

## 2023-07-03 DIAGNOSIS — C679 Malignant neoplasm of bladder, unspecified: Secondary | ICD-10-CM | POA: Insufficient documentation

## 2023-07-03 DIAGNOSIS — Z7984 Long term (current) use of oral hypoglycemic drugs: Secondary | ICD-10-CM | POA: Insufficient documentation

## 2023-07-03 DIAGNOSIS — I517 Cardiomegaly: Secondary | ICD-10-CM | POA: Insufficient documentation

## 2023-07-03 DIAGNOSIS — Z905 Acquired absence of kidney: Secondary | ICD-10-CM | POA: Diagnosis not present

## 2023-07-03 DIAGNOSIS — E119 Type 2 diabetes mellitus without complications: Secondary | ICD-10-CM | POA: Insufficient documentation

## 2023-07-03 HISTORY — DX: Type 2 diabetes mellitus without complications: E11.9

## 2023-07-03 HISTORY — DX: Atherosclerotic heart disease of native coronary artery without angina pectoris: I25.10

## 2023-07-03 HISTORY — DX: Angina pectoris, unspecified: I20.9

## 2023-07-03 LAB — CBC
HCT: 47.5 % (ref 39.0–52.0)
Hemoglobin: 15.6 g/dL (ref 13.0–17.0)
MCH: 30.9 pg (ref 26.0–34.0)
MCHC: 32.8 g/dL (ref 30.0–36.0)
MCV: 94.1 fL (ref 80.0–100.0)
Platelets: 229 10*3/uL (ref 150–400)
RBC: 5.05 MIL/uL (ref 4.22–5.81)
RDW: 13.3 % (ref 11.5–15.5)
WBC: 10.5 10*3/uL (ref 4.0–10.5)
nRBC: 0 % (ref 0.0–0.2)

## 2023-07-03 LAB — COMPREHENSIVE METABOLIC PANEL WITH GFR
ALT: 31 U/L (ref 0–44)
AST: 26 U/L (ref 15–41)
Albumin: 3.7 g/dL (ref 3.5–5.0)
Alkaline Phosphatase: 79 U/L (ref 38–126)
Anion gap: 10 (ref 5–15)
BUN: 12 mg/dL (ref 8–23)
CO2: 24 mmol/L (ref 22–32)
Calcium: 9.1 mg/dL (ref 8.9–10.3)
Chloride: 101 mmol/L (ref 98–111)
Creatinine, Ser: 1.02 mg/dL (ref 0.61–1.24)
GFR, Estimated: >60 mL/min (ref >60)
Glucose, Bld: 140 mg/dL — ABNORMAL HIGH (ref 70–99)
Potassium: 3.9 mmol/L (ref 3.5–5.1)
Sodium: 135 mmol/L (ref 135–145)
Total Bilirubin: 0.8 mg/dL (ref 0.0–1.2)
Total Protein: 7.1 g/dL (ref 6.5–8.1)

## 2023-07-03 LAB — SURGICAL PCR SCREEN
MRSA, PCR: NEGATIVE
Staphylococcus aureus: NEGATIVE

## 2023-07-03 LAB — GLUCOSE, CAPILLARY: Glucose-Capillary: 159 mg/dL — ABNORMAL HIGH (ref 70–99)

## 2023-07-03 NOTE — Progress Notes (Signed)
 For Anesthesia: PCP - Jimmey Mould, MD  Cardiologist - Millicent Ally, MD  Clearance: Ervin Heath: PA: Clearance. Bowel Prep reminder: N/A  Chest x-ray -  EKG - 06/19/23 Stress Test -  ECHO - 08/21/22 Cardiac Cath - 04/2022 Pacemaker/ICD device last checked: Pacemaker orders received: Device Rep notified:  Spinal Cord Stimulator:N/A  Sleep Study - Yes CPAP - NO  Fasting Blood Sugar - N/A Checks Blood Sugar ___0__ times a day Date and result of last Hgb A1c-  Last dose of GLP1 agonist- N/A GLP1 instructions:   Last dose of SGLT-2 inhibitors- N/A SGLT-2 instructions:   Blood Thinner Instructions: Aspirin Instructions:To hold 5 days before surgery. Last Dose:  Activity level: Can go up a flight of stairs and activities of daily living without stopping and without chest pain and/or shortness of breath   Able to exercise without chest pain and/or shortness of breath   Anesthesia review: Hx: CAD, HTN, HLD, DM2,Smoker.   Patient denies shortness of breath, fever, cough and chest pain at PAT appointment   Patient verbalized understanding of instructions that were given to them at the PAT appointment. Patient was also instructed that they will need to review over the PAT instructions again at home before surgery.

## 2023-07-05 LAB — HEMOGLOBIN A1C
Hgb A1c MFr Bld: 8.5 % — ABNORMAL HIGH (ref 4.8–5.6)
Mean Plasma Glucose: 197 mg/dL

## 2023-07-08 NOTE — Progress Notes (Signed)
 Lab. Results: A1C: 8.5.

## 2023-07-09 ENCOUNTER — Encounter (HOSPITAL_COMMUNITY): Payer: Self-pay

## 2023-07-09 ENCOUNTER — Telehealth: Payer: Self-pay | Admitting: Internal Medicine

## 2023-07-09 NOTE — Telephone Encounter (Signed)
 Good morning Dr. Elvin Hammer,   Patient called requesting to be scheduled for a colonoscopy. Patient last had a colonoscopy in 04/2017 and had a recall for 07/2017. Stated he is currently not having any symptoms. Please advise if patient can be scheduled directly for colonoscopy.   Thank you.

## 2023-07-09 NOTE — Telephone Encounter (Signed)
 Well overdue for his follow-up colonoscopy. Yes, okay to schedule for direct colonoscopy.  Reason "history of multiple colon polyps" Thanks

## 2023-07-09 NOTE — Anesthesia Preprocedure Evaluation (Addendum)
 Anesthesia Evaluation  Patient identified by MRN, date of birth, ID band Patient awake    Reviewed: Allergy & Precautions, NPO status , Patient's Chart, lab work & pertinent test results  History of Anesthesia Complications Negative for: history of anesthetic complications  Airway Mallampati: III  TM Distance: >3 FB Neck ROM: Full    Dental  (+) Dental Advisory Given   Pulmonary neg shortness of breath, neg sleep apnea, neg COPD, neg recent URI, Current Smoker (smokes 1/2 ppd) and Patient abstained from smoking.   Pulmonary exam normal breath sounds clear to auscultation       Cardiovascular hypertension (amlodipine , irbesartan , metoprolol ), Pt. on medications and Pt. on home beta blockers (-) angina + CAD and + CABG (x1 04/2022)   Rhythm:Regular Rate:Normal  HLD  TTE 08/21/2022: IMPRESSIONS    1. Left ventricular ejection fraction, by estimation, is 50 to 55%. The  left ventricle has low normal function. The left ventricle has no regional  wall motion abnormalities. There is mild left ventricular hypertrophy.  Left ventricular diastolic  parameters were normal.   2. Right ventricular systolic function is normal. The right ventricular  size is normal. There is normal pulmonary artery systolic pressure.   3. No evidence of mitral valve regurgitation.   4. The aortic valve is normal in structure. Aortic valve regurgitation is  trivial.   5. The inferior vena cava is normal in size with greater than 50%  respiratory variability, suggesting right atrial pressure of 3 mmHg.     Neuro/Psych negative neurological ROS     GI/Hepatic negative GI ROS, Neg liver ROS,,,  Endo/Other  diabetes (Hgb A1c 8.5), Poorly Controlled, Type 2, Oral Hypoglycemic Agents    Renal/GU Renal disease (cancer s/p nephrectomy)   Bladder cancer    Musculoskeletal   Abdominal   Peds  Hematology negative hematology ROS (+) Lab Results       Component                Value               Date                      WBC                      10.5                07/03/2023                HGB                      15.6                07/03/2023                HCT                      47.5                07/03/2023                MCV                      94.1                07/03/2023                PLT  229                 07/03/2023              Anesthesia Other Findings   Reproductive/Obstetrics                             Anesthesia Physical Anesthesia Plan  ASA: 3  Anesthesia Plan: General   Post-op Pain Management: Tylenol  PO (pre-op)*   Induction: Intravenous  PONV Risk Score and Plan: 1 and Ondansetron , Dexamethasone  and Treatment may vary due to age or medical condition  Airway Management Planned: Oral ETT and Video Laryngoscope Planned  Additional Equipment:   Intra-op Plan:   Post-operative Plan: Extubation in OR  Informed Consent: I have reviewed the patients History and Physical, chart, labs and discussed the procedure including the risks, benefits and alternatives for the proposed anesthesia with the patient or authorized representative who has indicated his/her understanding and acceptance.     Dental advisory given  Plan Discussed with: CRNA and Anesthesiologist  Anesthesia Plan Comments: (See PAT note from 4/16  Risks of general anesthesia discussed including, but not limited to, sore throat, hoarse voice, chipped/damaged teeth, injury to vocal cords, nausea and vomiting, allergic reactions, lung infection, heart attack, stroke, and death. All questions answered. )        Anesthesia Quick Evaluation

## 2023-07-09 NOTE — Progress Notes (Signed)
 Case: [id number] Date/Time: 07/11/23 1139   Procedures:      TURBT (TRANSURETHRAL RESECTION OF BLADDER TUMOR)     CYSTOSCOPY, WITH RETROGRADE PYELOGRAM (Bilateral)   Anesthesia type: General   Diagnosis: Malignant neoplasm of urinary bladder, unspecified site [facility]) [C67.9]   Pre-op diagnosis: BLADDER CANCER   Location[facility] ROOM 03 / [facility]   Surgeons: Florencio Hunting, MD       DISCUSSION: Grant Watkins is a 68 yo male who presents to PAT  prior to surgery above. PMH of every day smoking, HTN, CAD s/p CABG x 1 (04/2022), T2DM, hx of RCC s/p right nephrectomy (2012), hx of bladder cancer.  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 Last seen in clinic on 06/19/23 for clearance prior to surgery. BP noted to be elevated and Metoprolol  increased, Amlodipine  added. He reports chronic chest wall soreness from bypass but otherwise denies any complaints. Cleared for surgery:  "Chart reviewed as part of pre-operative protocol coverage. Given past medical history and time since last visit, based on ACC/AHA guidelines, Grant Watkins is at acceptable risk for the planned procedure without further cardiovascular testing.  If needed, he may reduce aspirin to 81 mg prior to the surgery. The patient was advised that if he develops new symptoms prior to surgery to contact our office to arrange for a follow-up visit, and he verbalized understanding."  Patient with Diabetes, followed by PCP. A1c elevated at 8.5. Anticipate he can proceed.   VS: BP (!) 150/92   Pulse 87   Temp 37.1 C (Oral)   Ht 5\' 10"  (1.778 m)   Wt 75.3 kg   SpO2 97%   BMI 23.82 kg/m   PROVIDERS: Jimmey Mould, MD   LABS: Labs reviewed: Acceptable for surgery. (all labs ordered are listed, but only abnormal results are displayed)  Labs Reviewed  HEMOGLOBIN A1C - Abnormal; Notable for the following components:       Result Value   Hgb A1c MFr Bld 8.5 (*)    All other components within normal limits  COMPREHENSIVE METABOLIC PANEL WITH GFR - Abnormal; Notable for the following components:   Glucose, Bld 140 (*)    All other components within normal limits  GLUCOSE, CAPILLARY - Abnormal; Notable for the following components:   Glucose-Capillary 159 (*)    All other components within normal limits  SURGICAL PCR SCREEN  CBC     IMAGES:   EKG 06/19/23:  Normal sinus rhythm, rate 90 Possible Left atrial enlargement Nonspecific ST abnormality When compared with ECG of 21-Feb-2023 15:36, No significant change was found  CV:  Echo 08/21/22:  IMPRESSIONS    1. Left ventricular ejection fraction, by estimation, is 50 to 55%. The left ventricle has low normal function. The left ventricle has no regional wall motion abnormalities. There is mild left ventricular hypertrophy. Left ventricular diastolic parameters were normal.  2. Right ventricular systolic function is normal. The right ventricular size is normal. There is normal pulmonary artery systolic pressure.  3. No evidence of mitral valve regurgitation.  4. The aortic valve is normal in structure. Aortic valve regurgitation is trivial.  5. The inferior vena cava is normal in size with greater than 50% respiratory variability, suggesting right atrial pressure of 3 mmHg.   LHC 05/07/22 [facility]):  Diagnostic Findings:   LAD: The left anterior descending coronary artery  has a separate ostium and arises from the left sinus of valsalva. It terminates at the apex. The Proximal LAD is large in size. There is a 70% lesion in the ostial portion of the Proximal LAD. The Mid LAD is medium in size and mildly diseased. The Distal LAD is small in size and mildly diseased. The 1st Diagonal is medium in size and mildly diseased. The 2nd Diagonal is small in size and mildly diseased.   Circumflex: The circumflex coronary artery has a separate ostium  and arises from the left sinus of valsalva. The Proximal Circumflex is large in size and mildly diseased. The Distal Circumflex is small in size and mildly diseased. The 1st Marginal is medium in size and mildly diseased. The 2nd Marginal is medium in size and mildly diseased. The 1st LPL is small in size and mildly diseased.   RCA: The right coronary artery arises from the right sinus of valsalva, is a large caliber vessel and mildly diseased. The Right PDA is medium in size and mildly diseased. The 1st RPL is small in size and mildly diseased. The 2nd RPL is small in size and mildly diseased. The 3rd RPL is small in size and mildly diseased.   Past Medical History:  Diagnosis Date   Adopted    Anginal pain (HCC)    Bladder cancer (HCC)    Coronary artery disease    Diabetes mellitus without complication (HCC)    History of bladder cancer 05/30/2017   History of kidney cancer 05/30/2017   Hypertension    Kidney carcinoma [facility])     Past Surgical History:  Procedure Laterality Date   BLADDER SURGERY     x 6   BYPASS GRAFT AORTA TO AORTA  2024   CYSTOSCOPY     7 or 8  surgeries   CYSTOSCOPY W/ RETROGRADES Left 09/29/2012   Procedure: CYSTOSCOPY WITH LEFT  RETROGRADE PYELOGRAM;  Surgeon: Kristeen Peto, MD;  Location[facility];  Service: Urology;  Laterality: Left;   CYSTOSCOPY WITH BIOPSY N/A 09/29/2012   Procedure: CYSTOSCOPY WITH BIOPSY;  Surgeon: Kristeen Peto, MD;  Location[facility];  Service: Urology;  Laterality: N/A;  BLADDER BIOPSIES     INGUINAL HERNIA REPAIR Bilateral    as child   NEPHRECTOMY Right     MEDICATIONS:  amLODipine  (NORVASC ) 2.5 MG tablet   aspirin EC 81 MG tablet   atorvastatin  (LIPITOR) 40 MG tablet   irbesartan  (AVAPRO ) 300 MG tablet   metFORMIN (GLUCOPHAGE-XR) 500 MG 24 hr tablet   metoprolol  succinate (TOPROL -XL) 100 MG 24 hr tablet    0.9 %  sodium chloride  infusion   Antoinette Kirschner [facility] Short Stay/Anesthesiology [facility] Phone [phone] 07/09/2023 8:46 AM

## 2023-07-10 NOTE — H&P (Signed)
 1. Bladder cancer  2. History of renal cell carcinoma  3. Prostate nodule   Grant Watkins returns today primarily for follow-up of his cystoscopic findings in December. He was noted to have a small less than 1 cm frondular area of the left lateral bladder wall. His cytology was negative and he elected to monitor this and follows up 3 months later for surveillance cystoscopy. He denies any hematuria or change in voiding habits.     ALLERGIES: No Allergies    MEDICATIONS: Metoprolol  Succinate  Amlodipine  Besylate 5 mg tablet Oral  Lisinopril-Hydrochlorothiazide 20 mg-12.5 mg tablet Oral  Losartan  Potassium  Metformin Er Gastric     GU PSH: Cysto Bladder Ureth Biopsy - 2014 Cystoscopy - 03/06/2023, 03/02/2022, 12/12/2020, 2020, 2019, 2017 Cystoscopy TURBT <2 cm - 2009 Lap Nephro Ureterectomy - 2012 Locm 300-399Mg /Ml Iodine,1Ml - 2019 Remove Ureter - 2012       PSH Notes: Cystoscopy With Biopsy, Ureterectomy, Kidney Surgery Laparoscopically Assisted Nephroureterectomy, Cystoscopy With Fulguration Small Lesion (5-23mm), Cystoscopy Bladder Tumor   NON-GU PSH: No Non-GU PSH    GU PMH: History of bladder cancer - 03/06/2023, - 03/02/2022, - 12/12/2020, Bladder Cancer, - 2014 Prostate nodule w/o LUTS - 03/06/2023, - 03/02/2022, - 12/12/2020, Nodular prostate without lower urinary tract symptoms, - 2015 Acute Cystitis/UTI - 12/07/2022 Bladder Cancer overlapping sites - 12/07/2022, Malignant neoplasm of overlapping sites of bladder, - 2016 Dysuria - 12/07/2022 Gross hematuria - 12/07/2022 Urinary Frequency - 12/07/2022 Urinary Urgency - 12/07/2022 History of kidney cancer - 2019 Low back pain - 2019 Renal cell carcinoma, right, Renal cell carcinoma, right - 2016      PMH Notes:   1) Bladder cancer: 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 He has undergone transurethral resections in October 2004, June 2006, March of 2008 and most recently April 2009. He does smoke. He  completed trial (SPI-612). His right ureter was removed during his nephrectomy in May 2012 performed for renal cell carcinoma. He was found to have an atypical cytology and positive FISH in June 2014.   June 2014: Bladder biopsies - benign  Last upper tract evaluation: Jul 2014, [facility]   2) Renal cell carcinoma: He is s/p a right nephroureterectomy on 07/24/10 for a 6.3 cm renal tumor that was incidentally noted during upper tract surveillance for urothelial carcinoma.   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 He did undergo a prostate biopsy in March 2008 which was negative for prostate cancer.      NON-GU PMH: Hypertension    FAMILY HISTORY: No pertinent family history - Other   SOCIAL HISTORY: No Social History     Notes: Former smoker, Alcohol Use, Marital History - Currently Married, Tobacco Use, Occupation:   REVIEW OF SYSTEMS:    GU Review Male:   Patient denies frequent urination, hard to postpone urination, burning/ pain with urination, get up at night to urinate, leakage of urine, stream starts and stops, trouble starting your streams, and have to strain to urinate .  Gastrointestinal (Lower):   Patient denies diarrhea and constipation.  Gastrointestinal (Upper):   Patient denies nausea and vomiting.  Constitutional:   Patient denies fever, night sweats, weight loss, and fatigue.  Skin:   Patient denies itching and skin rash/ lesion.  Eyes:   Patient denies blurred vision and double vision.  Ears/ Nose/ Throat:   Patient denies sore throat and sinus problems.  Hematologic/Lymphatic:   Patient denies swollen glands and easy bruising.  Cardiovascular:   Patient denies leg swelling and chest pains.  Respiratory:   Patient denies cough and shortness of breath.  Endocrine:   Patient  denies excessive thirst.  Musculoskeletal:   Patient denies back pain and joint pain.  Neurological:   Patient denies headaches and dizziness.  Psychologic:   Patient denies depression and anxiety.      MULTI-SYSTEM PHYSICAL EXAMINATION:    Constitutional: Well-nourished. No physical deformities. Normally developed. Good grooming.  Lungs: Clear CV: RRR    Complexity of Data:  Records Review:   Previous Patient Records   02/27/23 12/12/20 01/07/19 10/03/17 12/29/15 12/30/14 12/25/13 02/21/12  PSA  Total PSA 1.11 ng/mL 2.19 ng/mL 1.16 ng/mL 1.17 ng/mL 1.11  0.88  0.86  0.76            ASSESSMENT:      ICD-10 Details  1 GU:   History of bladder cancer - Z85.51    PLAN:         1. Bladder cancer: His cystoscopic findings are stable from December. As such, we will continue to monitor this area as long as his cytology today is negative. He does already have a scheduled appointment in December for this purpose.   2. Prostate nodule: He will have a PSA/DRE next December.          APPENDED NOTES:    I reviewed his cytology result with him which was positive for a low-grade malignancy. As such, I did recommend that we proceed with transurethral resection/biopsy. We discussed that procedure in detail and he is agreeable. This will be scheduled for later this spring.

## 2023-07-11 ENCOUNTER — Ambulatory Visit (HOSPITAL_COMMUNITY)

## 2023-07-11 ENCOUNTER — Ambulatory Visit (HOSPITAL_COMMUNITY)
Admission: RE | Admit: 2023-07-11 | Discharge: 2023-07-11 | Disposition: A | Discharge status: Home / Self Care | Source: Ambulatory Visit | Attending: Urology | Admitting: Urology

## 2023-07-11 ENCOUNTER — Encounter (HOSPITAL_COMMUNITY): Payer: Self-pay | Admitting: Urology

## 2023-07-11 ENCOUNTER — Ambulatory Visit (HOSPITAL_COMMUNITY): Admitting: Medical

## 2023-07-11 ENCOUNTER — Encounter (HOSPITAL_COMMUNITY)
Admission: RE | Disposition: A | Discharge status: Home / Self Care | Payer: Self-pay | Source: Ambulatory Visit | Attending: Urology

## 2023-07-11 ENCOUNTER — Ambulatory Visit (HOSPITAL_BASED_OUTPATIENT_CLINIC_OR_DEPARTMENT_OTHER): Payer: Self-pay | Admitting: Anesthesiology

## 2023-07-11 DIAGNOSIS — I2510 Atherosclerotic heart disease of native coronary artery without angina pectoris: Secondary | ICD-10-CM | POA: Insufficient documentation

## 2023-07-11 DIAGNOSIS — Z79899 Other long term (current) drug therapy: Secondary | ICD-10-CM | POA: Insufficient documentation

## 2023-07-11 DIAGNOSIS — D090 Carcinoma in situ of bladder: Secondary | ICD-10-CM | POA: Diagnosis not present

## 2023-07-11 DIAGNOSIS — Z905 Acquired absence of kidney: Secondary | ICD-10-CM | POA: Insufficient documentation

## 2023-07-11 DIAGNOSIS — Z7984 Long term (current) use of oral hypoglycemic drugs: Secondary | ICD-10-CM | POA: Insufficient documentation

## 2023-07-11 DIAGNOSIS — E119 Type 2 diabetes mellitus without complications: Secondary | ICD-10-CM | POA: Insufficient documentation

## 2023-07-11 DIAGNOSIS — D494 Neoplasm of unspecified behavior of bladder: Secondary | ICD-10-CM

## 2023-07-11 DIAGNOSIS — N402 Nodular prostate without lower urinary tract symptoms: Secondary | ICD-10-CM | POA: Diagnosis not present

## 2023-07-11 DIAGNOSIS — Z85528 Personal history of other malignant neoplasm of kidney: Secondary | ICD-10-CM | POA: Insufficient documentation

## 2023-07-11 DIAGNOSIS — F17210 Nicotine dependence, cigarettes, uncomplicated: Secondary | ICD-10-CM | POA: Insufficient documentation

## 2023-07-11 DIAGNOSIS — E785 Hyperlipidemia, unspecified: Secondary | ICD-10-CM | POA: Insufficient documentation

## 2023-07-11 DIAGNOSIS — C679 Malignant neoplasm of bladder, unspecified: Secondary | ICD-10-CM | POA: Diagnosis not present

## 2023-07-11 DIAGNOSIS — I10 Essential (primary) hypertension: Secondary | ICD-10-CM | POA: Insufficient documentation

## 2023-07-11 HISTORY — PX: CYSTOSCOPY W/ RETROGRADES: SHX1426

## 2023-07-11 HISTORY — PX: TRANSURETHRAL RESECTION OF BLADDER TUMOR: SHX2575

## 2023-07-11 LAB — GLUCOSE, CAPILLARY
Glucose-Capillary: 191 mg/dL — ABNORMAL HIGH (ref 70–99)
Glucose-Capillary: 141 mg/dL — ABNORMAL HIGH (ref 70–99)

## 2023-07-11 SURGERY — TURBT (TRANSURETHRAL RESECTION OF BLADDER TUMOR)
Anesthesia: General

## 2023-07-11 MED ORDER — PHENYLEPHRINE 80 MCG/ML (10ML) SYRINGE FOR IV PUSH (FOR BLOOD PRESSURE SUPPORT)
PREFILLED_SYRINGE | INTRAVENOUS | Status: DC | PRN
Start: 2023-07-11 — End: 2023-07-11
  Administered 2023-07-11: 160 ug via INTRAVENOUS

## 2023-07-11 MED ORDER — ONDANSETRON HCL 4 MG/2ML IJ SOLN
INTRAMUSCULAR | Status: DC | PRN
  Administered 2023-07-11: 4 mg via INTRAVENOUS

## 2023-07-11 MED ORDER — DEXAMETHASONE SODIUM PHOSPHATE 10 MG/ML IJ SOLN
INTRAMUSCULAR | Status: DC | PRN
  Administered 2023-07-11: 10 mg via INTRAVENOUS

## 2023-07-11 MED ORDER — PROPOFOL 10 MG/ML IV BOLUS
INTRAVENOUS | Status: DC | PRN
  Administered 2023-07-11: 150 mg via INTRAVENOUS

## 2023-07-11 MED ORDER — FENTANYL CITRATE (PF) 100 MCG/2ML IJ SOLN
INTRAMUSCULAR | Status: AC
  Filled 2023-07-11: qty 2

## 2023-07-11 MED ORDER — CEFAZOLIN SODIUM-DEXTROSE 2-4 GM/100ML-% IV SOLN
2.0000 g | INTRAVENOUS | Status: AC
  Administered 2023-07-11: 2 g via INTRAVENOUS
  Filled 2023-07-11: qty 100

## 2023-07-11 MED ORDER — ROCURONIUM BROMIDE 10 MG/ML (PF) SYRINGE
PREFILLED_SYRINGE | INTRAVENOUS | Status: DC | PRN
Start: 2023-07-11 — End: 2023-07-11
  Administered 2023-07-11: 60 mg via INTRAVENOUS

## 2023-07-11 MED ORDER — CHLORHEXIDINE GLUCONATE 0.12 % MT SOLN
15.0000 mL | Freq: Once | OROMUCOSAL | Status: AC
  Administered 2023-07-11: 15 mL via OROMUCOSAL

## 2023-07-11 MED ORDER — FENTANYL CITRATE (PF) 100 MCG/2ML IJ SOLN
INTRAMUSCULAR | Status: DC | PRN
  Administered 2023-07-11: 100 ug via INTRAVENOUS

## 2023-07-11 MED ORDER — AMISULPRIDE (ANTIEMETIC) 5 MG/2ML IV SOLN
10.0000 mg | Freq: Once | INTRAVENOUS | Status: DC | PRN
Start: 2023-07-11 — End: 2023-07-11

## 2023-07-11 MED ORDER — FENTANYL CITRATE PF 50 MCG/ML IJ SOSY: 25.0000 ug | PREFILLED_SYRINGE | INTRAMUSCULAR | Status: DC | PRN

## 2023-07-11 MED ORDER — PROPOFOL 10 MG/ML IV BOLUS
INTRAVENOUS | Status: AC
  Filled 2023-07-11: qty 20

## 2023-07-11 MED ORDER — LACTATED RINGERS IV SOLN: INTRAVENOUS | Status: DC

## 2023-07-11 MED ORDER — ORAL CARE MOUTH RINSE: 15.0000 mL | Freq: Once | OROMUCOSAL | Status: AC

## 2023-07-11 MED ORDER — PHENAZOPYRIDINE HCL 200 MG PO TABS: 200.0000 mg | ORAL_TABLET | Freq: Three times a day (TID) | ORAL | 0 refills | Status: AC | PRN

## 2023-07-11 MED ORDER — INSULIN ASPART 100 UNIT/ML IJ SOLN
0.0000 [IU] | INTRAMUSCULAR | Status: DC | PRN
  Administered 2023-07-11: 4 [IU] via SUBCUTANEOUS
  Filled 2023-07-11: qty 1

## 2023-07-11 MED ORDER — IOHEXOL 300 MG/ML  SOLN
INTRAMUSCULAR | Status: DC | PRN
  Administered 2023-07-11: 5 mL

## 2023-07-11 MED ORDER — OXYCODONE HCL 5 MG PO TABS: 5.0000 mg | ORAL_TABLET | Freq: Once | ORAL | Status: DC | PRN

## 2023-07-11 MED ORDER — OXYCODONE HCL 5 MG/5ML PO SOLN: 5.0000 mg | Freq: Once | ORAL | Status: DC | PRN

## 2023-07-11 MED ORDER — SODIUM CHLORIDE 0.9 % IR SOLN
Status: DC | PRN
  Administered 2023-07-11: 3000 mL via INTRAVESICAL

## 2023-07-11 MED ORDER — PHENYLEPHRINE 80 MCG/ML (10ML) SYRINGE FOR IV PUSH (FOR BLOOD PRESSURE SUPPORT)
PREFILLED_SYRINGE | INTRAVENOUS | Status: AC
  Filled 2023-07-11: qty 10

## 2023-07-11 MED ORDER — LIDOCAINE HCL (PF) 2 % IJ SOLN
INTRAMUSCULAR | Status: DC | PRN
  Administered 2023-07-11: 100 mg via INTRADERMAL

## 2023-07-11 MED ORDER — SUGAMMADEX SODIUM 200 MG/2ML IV SOLN
INTRAVENOUS | Status: DC | PRN
  Administered 2023-07-11: 400 mg via INTRAVENOUS

## 2023-07-11 SURGICAL SUPPLY — 17 items
BAG URINE DRAIN 2000ML AR STRL (UROLOGICAL SUPPLIES) IMPLANT
BAG URO CATCHER STRL LF (MISCELLANEOUS) ×2 IMPLANT
CATH URETL OPEN END 6FR 70 (CATHETERS) IMPLANT
CLOTH BEACON ORANGE TIMEOUT ST (SAFETY) ×2 IMPLANT
DRAPE FOOT SWITCH (DRAPES) ×2 IMPLANT
ELECT REM PT RETURN 15FT ADLT (MISCELLANEOUS) ×2 IMPLANT
GLOVE SURG LX STRL 7.5 STRW (GLOVE) ×2 IMPLANT
GOWN STRL REUS W/ TWL XL LVL3 (GOWN DISPOSABLE) ×2 IMPLANT
GUIDEWIRE STR DUAL SENSOR (WIRE) ×2 IMPLANT
KIT TURNOVER KIT A (KITS) IMPLANT
LOOP CUT BIPOLAR 24F LRG (ELECTROSURGICAL) IMPLANT
MANIFOLD NEPTUNE II (INSTRUMENTS) ×2 IMPLANT
NS IRRIG 1000ML POUR BTL (IV SOLUTION) IMPLANT
PACK CYSTO (CUSTOM PROCEDURE TRAY) ×2 IMPLANT
SYRINGE TOOMEY IRRIG 70ML (MISCELLANEOUS) IMPLANT
TUBING CONNECTING 10 (TUBING) ×2 IMPLANT
TUBING UROLOGY SET (TUBING) ×2 IMPLANT

## 2023-07-11 NOTE — Discharge Instructions (Signed)
 You may see some blood in the urine and may have some burning with urination for 48-72 hours. You also may notice that you have to urinate more frequently or urgently after your procedure which is normal.  You should call should you develop an inability urinate, fever > 101, persistent nausea and vomiting that prevents you from eating or drinking to stay hydrated.

## 2023-07-11 NOTE — Anesthesia Procedure Notes (Signed)
 Procedure Name: Intubation Date/Time: 07/11/2023 11:09 AM  Performed by: Micky Albee, CRNAPre-anesthesia Checklist: Patient identified, Emergency Drugs available, Suction available, Patient being monitored and Timeout performed Patient Re-evaluated:Patient Re-evaluated prior to induction Oxygen Delivery Method: Circle system utilized Preoxygenation: Pre-oxygenation with 100% oxygen Induction Type: IV induction Ventilation: Mask ventilation without difficulty Laryngoscope Size: Mac, 3 and Glidescope Grade View: Grade I Tube type: Oral Tube size: 7.5 mm Number of attempts: 1 Airway Equipment and Method: Stylet Placement Confirmation: ETT inserted through vocal cords under direct vision, positive ETCO2 and breath sounds checked- equal and bilateral Secured at: 23 cm Tube secured with: Tape Dental Injury: Teeth and Oropharynx as per pre-operative assessment

## 2023-07-11 NOTE — Op Note (Signed)
 Preoperative diagnosis: Bladder tumor (1 cm)  Postoperative diagnosis: Bladder tumor (1 cm)  Procedures: 1.  Cystoscopy 2.  Left retrograde pyelogram with interpretation 3.  Transurethral resection of left-sided bladder tumor 4.  Transurethral bladder biopsy of right trigone  Surgeon: Izetta Marshall MD  Anesthesia: General  Complications: None  EBL: Minimal  Specimens: 1.  Left lateral bladder tumor 2.  Bladder biopsy of right trigone  Disposition of specimen: Pathology  Indication: Grant Watkins is a 68 year old gentleman with a history of urothelial carcinoma of the bladder.  He also has a history of renal cell carcinoma the right kidney status post right nephroureterectomy.  His entire right ureter was removed at the time of nephrectomy due to his history of urothelial carcinoma.  He presents after undergoing office cystoscopy which demonstrated an area on the left lateral bladder concerning for a small less than 1 cm bladder tumor.  His cytology was suspicious for low-grade urothelial carcinoma.  He therefore presents for cystoscopy and transurethral resection and left retrograde pyelogram.  The potential risks, complications, and the expected recovery process were discussed in detail.  Informed consent was obtained.  Description of procedure: The patient was taken to the operating room and a general anesthetic was administered.  He was given preoperative antibiotics, placed in the dorsolithotomy position, prepped and draped in the usual sterile fashion.  Next, preoperative timeout was performed.  Cystourethroscopy was then performed which revealed a normal anterior and posterior urethra.  Inspection of bladder revealed a frondular small bladder tumor off the left lateral bladder wall as previously noted on office cystoscopy.  His left ureteral orifice was in the normal expected location and effluxing clear urine.  The right ureteral orifice was surgically absent.  There was an area  of some very mild erythema surrounding this vicinity.  Attention then turned to the left ureteral orifice.  A 6 French ureteral catheter was used to intubate the left ureteral orifice and Omnipaque  contrast was injected.  This revealed a normal caliber ureter and renal collecting system without filling defects.  Attention then returned to the bladder.  The cystoscope was replaced with a 26 French resectoscope.  Using transurethral bipolar resection, the bladder tumor was resected with underlying tissue.  Hemostasis was achieved with bipolar cautery.  Attention then turned to the right hemitrigone.  This area was somewhat erythematous and was able to be biopsied with the bipolar loop as well.  Hemostasis was again achieved with bipolar cautery.  The bladder was emptied and reinspected.  Hemostasis appeared excellent.  The bladder was drained and the procedure was ended.  He tolerated the procedure well without complications.  He was able to be extubated and transferred to the recovery unit in satisfactory condition.

## 2023-07-11 NOTE — Anesthesia Postprocedure Evaluation (Signed)
 Anesthesia Post Note  Patient: Grant Watkins  Procedure(s) Performed: TURBT (TRANSURETHRAL RESECTION OF BLADDER TUMOR) CYSTOSCOPY, WITH LEFT RETROGRADE PYELOGRAM (Bilateral)     Patient location during evaluation: PACU Anesthesia Type: General Level of consciousness: awake Pain management: pain level controlled Vital Signs Assessment: post-procedure vital signs reviewed and stable Respiratory status: spontaneous breathing, nonlabored ventilation and respiratory function stable Cardiovascular status: blood pressure returned to baseline and stable Postop Assessment: no apparent nausea or vomiting Anesthetic complications: no   No notable events documented.  Last Vitals:  Vitals:   07/11/23 1225 07/11/23 1230  BP:  (!) 145/88  Pulse: 77 68  Resp:  18  Temp: 36.6 C 36.6 C  SpO2: 100% 99%    Last Pain:  Vitals:   07/11/23 1225  TempSrc:   PainSc: 1                  Conard Decent

## 2023-07-11 NOTE — Transfer of Care (Signed)
 Immediate Anesthesia Transfer of Care Note  Patient: Alease Amend  Procedure(s) Performed: TURBT (TRANSURETHRAL RESECTION OF BLADDER TUMOR) CYSTOSCOPY, WITH LEFT RETROGRADE PYELOGRAM (Bilateral)  Patient Location: PACU  Anesthesia Type:General  Level of Consciousness: awake, alert , and oriented  Airway & Oxygen Therapy: Patient Spontanous Breathing and Patient connected to face mask oxygen  Post-op Assessment: Report given to RN and Post -op Vital signs reviewed and stable  Post vital signs: Reviewed and stable  Last Vitals:  Vitals Value Taken Time  BP 132/86 07/11/23 1147  Temp 36.4 C 07/11/23 1147  Pulse 71 07/11/23 1150  Resp 12 07/11/23 1150  SpO2 100 % 07/11/23 1150  Vitals shown include unfiled device data.  Last Pain:  Vitals:   07/11/23 1147  TempSrc:   PainSc: 0-No pain         Complications: No notable events documented.

## 2023-07-12 ENCOUNTER — Encounter (HOSPITAL_COMMUNITY): Payer: Self-pay | Admitting: Urology

## 2023-07-15 LAB — SURGICAL PATHOLOGY

## 2023-07-16 ENCOUNTER — Encounter: Payer: Self-pay | Admitting: Internal Medicine

## 2023-07-23 DIAGNOSIS — R8271 Bacteriuria: Secondary | ICD-10-CM | POA: Diagnosis not present

## 2023-07-23 DIAGNOSIS — C678 Malignant neoplasm of overlapping sites of bladder: Secondary | ICD-10-CM | POA: Diagnosis not present

## 2023-08-26 ENCOUNTER — Ambulatory Visit (AMBULATORY_SURGERY_CENTER)

## 2023-08-26 ENCOUNTER — Encounter: Payer: Self-pay | Admitting: Internal Medicine

## 2023-08-26 VITALS — Ht 70.0 in | Wt 170.0 lb

## 2023-08-26 DIAGNOSIS — Z860100 Personal history of colon polyps, unspecified: Secondary | ICD-10-CM

## 2023-08-26 MED ORDER — NA SULFATE-K SULFATE-MG SULF 17.5-3.13-1.6 GM/177ML PO SOLN
1.0000 | Freq: Once | ORAL | 0 refills | Status: AC
Start: 2023-08-26 — End: 2023-08-26

## 2023-08-26 NOTE — Progress Notes (Signed)
 No egg or soy allergy known to patient  No issues known to pt with past sedation with any surgeries or procedures Patient denies ever being told they had issues or difficulty with intubation  No FH of Malignant Hyperthermia Pt is not on diet pills Pt is not on  home 02  Pt is not on blood thinners  Pt denies issues with constipation  No A fib or A flutter Have any cardiac testing pending--August 2025 Pt can ambulate independently Pt denies use of chewing tobacco Discussed diabetic I weight loss medication holds Discussed NSAID holds Checked BMI Pt instructed to use Single[employer]com or GoodRx for a price reduction on prep  Patient's chart reviewed by Rogena Class CNRA prior to previsit and patient appropriate for the [facility].  Pre visit completed and red dot placed by patient's name on their procedure day (on provider's schedule).

## 2023-09-09 ENCOUNTER — Ambulatory Visit: Admitting: Internal Medicine

## 2023-09-09 ENCOUNTER — Encounter: Payer: Self-pay | Admitting: Internal Medicine

## 2023-09-09 VITALS — BP 107/62 | HR 67 | Temp 97.5°F | Resp 14 | Ht 70.0 in | Wt 170.0 lb

## 2023-09-09 DIAGNOSIS — E119 Type 2 diabetes mellitus without complications: Secondary | ICD-10-CM | POA: Diagnosis not present

## 2023-09-09 DIAGNOSIS — K5730 Diverticulosis of large intestine without perforation or abscess without bleeding: Secondary | ICD-10-CM | POA: Diagnosis not present

## 2023-09-09 DIAGNOSIS — Z1211 Encounter for screening for malignant neoplasm of colon: Secondary | ICD-10-CM

## 2023-09-09 DIAGNOSIS — D120 Benign neoplasm of cecum: Secondary | ICD-10-CM | POA: Diagnosis not present

## 2023-09-09 DIAGNOSIS — D122 Benign neoplasm of ascending colon: Secondary | ICD-10-CM

## 2023-09-09 DIAGNOSIS — Z860101 Personal history of adenomatous and serrated colon polyps: Secondary | ICD-10-CM | POA: Diagnosis not present

## 2023-09-09 DIAGNOSIS — Z860100 Personal history of colon polyps, unspecified: Secondary | ICD-10-CM

## 2023-09-09 DIAGNOSIS — K6289 Other specified diseases of anus and rectum: Secondary | ICD-10-CM | POA: Diagnosis not present

## 2023-09-09 DIAGNOSIS — D123 Benign neoplasm of transverse colon: Secondary | ICD-10-CM

## 2023-09-09 DIAGNOSIS — K648 Other hemorrhoids: Secondary | ICD-10-CM

## 2023-09-09 DIAGNOSIS — I2510 Atherosclerotic heart disease of native coronary artery without angina pectoris: Secondary | ICD-10-CM | POA: Diagnosis not present

## 2023-09-09 MED ORDER — SODIUM CHLORIDE 0.9 % IV SOLN: 500.0000 mL | INTRAVENOUS | Status: DC

## 2023-09-09 NOTE — Op Note (Signed)
 [facility] Patient Name: Grant Watkins Procedure Date: 09/09/2023 10:56 AM MRN: [id number] Endoscopist: Norleen SAILOR. Abran , MD, [id number] Age: 68 Referring MD:  Date of Birth: [birth year] Gender: Male Account #: [id number] Procedure:                Colonoscopy with cold snare polypectomy x 3; biopsy                            polypectomy x 1 Indications:              High risk colon cancer surveillance: Personal                            history of adenoma (10 mm or greater in size), High                            risk colon cancer surveillance: Personal history of                            adenoma with high grade dysplasia, High risk colon                            cancer surveillance: Personal history of multiple                            (3 or more) adenomas. Previous, index, exam 2019.                            Large lateral spreading tumor in the rectum with                            high-grade dysplasia removed via EMR. Was to                            follow-up in 3 months. Follows up now (6 years). Medicines:                Monitored Anesthesia Care Procedure:                Pre-Anesthesia Assessment:                           - Prior to the procedure, a History and Physical                            was performed, and patient medications and                            allergies were reviewed. The patient's tolerance of                            previous anesthesia was also reviewed. The risks                            and benefits of the procedure and the sedation  options and risks were discussed with the patient.                            All questions were answered, and informed consent                            was obtained. Prior Anticoagulants: The patient has                            taken no anticoagulant or antiplatelet agents. ASA                            Grade Assessment: III - A patient with severe                             systemic disease. After reviewing the risks and                            benefits, the patient was deemed in satisfactory                            condition to undergo the procedure.                           After obtaining informed consent, the colonoscope                            was passed under direct vision. Throughout the                            procedure, the patient's blood pressure, pulse, and                            oxygen saturations were monitored continuously. The                            Olympus Scope [id number] was introduced through the                            anus and advanced to the the cecum, identified by                            appendiceal orifice and ileocecal valve. The                            ileocecal valve, appendiceal orifice, and rectum                            were photographed. The quality of the bowel                            preparation was excellent. The colonoscopy was  performed without difficulty. The patient tolerated                            the procedure well. The bowel preparation used was                            SUPREP via split dose instruction. Scope In: 11:13:33 AM Scope Out: 11:30:44 AM Scope Withdrawal Time: 0 hours 13 minutes 44 seconds  Total Procedure Duration: 0 hours 17 minutes 11 seconds  Findings:                 Three polyps were found in the transverse colon and                            cecum. The polyps were 2 to 5 mm in size. These                            polyps were removed with a cold snare. Resection                            and retrieval were complete.                           A 1 mm polyp was found in the ascending colon. The                            polyp was removed with a jumbo cold forceps.                            Resection and retrieval were complete.                           Diverticula were found in the sigmoid colon.                            Internal hemorrhoids were found during retroflexion.                           The exam was otherwise without abnormality on                            direct and retroflexion views. The area of previous                            large lateral spreading tumor of rectum without                            evidence of recurrence or residual adenoma. Only                            tattoo and scar. See images. Complications:            No immediate complications. Estimated blood loss:  None. Estimated Blood Loss:     Estimated blood loss: none. Impression:               - Three 2 to 5 mm polyps in the transverse colon                            and in the cecum, removed with a cold snare.                            Resected and retrieved.                           - One 1 mm polyp in the ascending colon, removed                            with a jumbo cold forceps. Resected and retrieved.                           - Diverticulosis in the sigmoid colon.                           - Internal hemorrhoids.                           - The examination was otherwise normal on direct                            and retroflexion views. Recommendation:           - Repeat colonoscopy in 3 years for surveillance.                           - Patient has a contact number available for                            emergencies. The signs and symptoms of potential                            delayed complications were discussed with the                            patient. Return to normal activities tomorrow.                            Written discharge instructions were provided to the                            patient.                           - Resume previous diet.                           - Continue present medications.                           -  Await pathology results. Norleen SAILOR. Abran, MD 09/09/2023 11:39:07 AM This report has been signed electronically.

## 2023-09-09 NOTE — Patient Instructions (Addendum)
 Repeat colonoscopy in 3 years for surveillance.  Please read over handouts provided Continue your normal medications   YOU HAD AN ENDOSCOPIC PROCEDURE TODAY AT THE  ENDOSCOPY CENTER:   Refer to the procedure report that was given to you for any specific questions about what was found during the examination.  If the procedure report does not answer your questions, please call your gastroenterologist to clarify.  If you requested that your care partner not be given the details of your procedure findings, then the procedure report has been included in a sealed envelope for you to review at your convenience later.  YOU SHOULD EXPECT: Some feelings of bloating in the abdomen. Passage of more gas than usual.  Walking can help get rid of the air that was put into your GI tract during the procedure and reduce the bloating. If you had a lower endoscopy (such as a colonoscopy or flexible sigmoidoscopy) you may notice spotting of blood in your stool or on the toilet paper. If you underwent a bowel prep for your procedure, you may not have a normal bowel movement for a few days.  Please Note:  You might notice some irritation and congestion in your nose or some drainage.  This is from the oxygen used during your procedure.  There is no need for concern and it should clear up in a day or so.  SYMPTOMS TO REPORT IMMEDIATELY:  Following lower endoscopy (colonoscopy or flexible sigmoidoscopy):  Excessive amounts of blood in the stool  Significant tenderness or worsening of abdominal pains  Swelling of the abdomen that is new, acute  Fever of 100F or higher  For urgent or emergent issues, a gastroenterologist can be reached at any hour by calling [phone]. Do not use MyChart messaging for urgent concerns.    DIET:  We do recommend a small meal at first, but then you may proceed to your regular diet.  Drink plenty of fluids but you should avoid alcoholic beverages for 24 hours.  ACTIVITY:  You  should plan to take it easy for the rest of today and you should NOT DRIVE or use heavy machinery until tomorrow (because of the sedation medicines used during the test).    FOLLOW UP: Our staff will call the number listed on your records the next business day following your procedure.  We will call around 7:15- 8:00 am to check on you and address any questions or concerns that you may have regarding the information given to you following your procedure. If we do not reach you, we will leave a message.     If any biopsies were taken you will be contacted by phone or by letter within the next 1-3 weeks.  Please call us  at [phone] if you have not heard about the biopsies in 3 weeks.    SIGNATURES/CONFIDENTIALITY: You and/or your care partner have signed paperwork which will be entered into your electronic medical record.  These signatures attest to the fact that that the information above on your After Visit Summary has been reviewed and is understood.  Full responsibility of the confidentiality of this discharge information lies with you and/or your care-partner.

## 2023-09-09 NOTE — Progress Notes (Signed)
 HISTORY OF PRESENT ILLNESS:  Grant Watkins is a 68 y.o. male with a personal history of multiple and advanced adenomatous colon polyps 2019.  Overdue for surveillance.  REVIEW OF SYSTEMS:  All non-GI ROS negative except for  Past Medical History:  Diagnosis Date   Adopted    Allergy    Anginal pain (HCC)    Bladder cancer (HCC)    Cataract    Coronary artery disease    Diabetes mellitus without complication (HCC)    History of bladder cancer 05/30/2017   History of kidney cancer 05/30/2017   Hypertension    Kidney carcinoma [facility])     Past Surgical History:  Procedure Laterality Date   BLADDER SURGERY     x 6   BYPASS GRAFT AORTA TO AORTA  2024   CYSTOSCOPY     7 or 8  surgeries   CYSTOSCOPY W/ RETROGRADES Left 09/29/2012   Procedure: CYSTOSCOPY WITH LEFT  RETROGRADE PYELOGRAM;  Surgeon: Noretta Ferrara, MD;  Location: [facility];  Service: Urology;  Laterality: Left;   CYSTOSCOPY W/ RETROGRADES Bilateral 07/11/2023   Procedure: CYSTOSCOPY, WITH LEFT RETROGRADE PYELOGRAM;  Surgeon: Ferrara Glance, MD;  Location: [facility];  Service: Urology;  Laterality: Bilateral;   CYSTOSCOPY WITH BIOPSY N/A 09/29/2012   Procedure: CYSTOSCOPY WITH BIOPSY;  Surgeon: Noretta Ferrara, MD;  Location: [facility];  Service: Urology;  Laterality: N/A;  BLADDER BIOPSIES     INGUINAL HERNIA REPAIR Bilateral    as child   NEPHRECTOMY Right    TRANSURETHRAL RESECTION OF BLADDER TUMOR N/A 07/11/2023   Procedure: TURBT (TRANSURETHRAL RESECTION OF BLADDER TUMOR);  Surgeon: Ferrara Glance, MD;  Location: [facility];  Service: Urology;  Laterality: N/A;    Social History Grant Watkins  reports that he has been smoking cigarettes. He has a 20 pack-year smoking history. He has never used smokeless tobacco. He reports current alcohol use. He reports that he does not use drugs.  family history is not on file. He was adopted.  No Known Allergies     PHYSICAL EXAMINATION: Vital signs: BP 121/79   Pulse 77   Temp (!)  97.5 F (36.4 C) (Temporal)   Ht 5' 10 (1.778 m)   Wt 170 lb (77.1 kg)   SpO2 96%   BMI 24.39 kg/m  General: Well-developed, well-nourished, no acute distress HEENT: Sclerae are anicteric, conjunctiva pink. Oral mucosa intact Lungs: Clear Heart: Regular Abdomen: soft, nontender, nondistended, no obvious ascites, no peritoneal signs, normal bowel sounds. No organomegaly. Extremities: No edema Psychiatric: alert and oriented x3. Cooperative     ASSESSMENT:  Personal history of multiple advanced adenomatous colon polyps.  Overdue for surveillance   PLAN: Surveillance colonoscopy

## 2023-09-09 NOTE — Progress Notes (Signed)
 To PACU via stretcher, sedated, good respiratory effort, VSS.

## 2023-09-10 ENCOUNTER — Telehealth: Payer: Self-pay | Admitting: *Deleted

## 2023-09-10 NOTE — Telephone Encounter (Signed)
 No answer on  follow up call. Left message.

## 2023-09-13 ENCOUNTER — Ambulatory Visit: Payer: Self-pay | Admitting: Internal Medicine

## 2023-09-13 LAB — SURGICAL PATHOLOGY

## 2023-10-15 NOTE — Progress Notes (Signed)
 HPI: Follow-up coronary artery disease.  Patient seen in [place] 2/24 with complaints of chest pain.  A nuclear study showed ischemia in the inferolateral wall.  Cardiac catheterization ultimately performed and was found to have disease in the LAD.  He had a LIMA to the LAD May 11, 2022.  Echocardiogram June 2024 showed normal LV function, mild left ventricular hypertrophy and trace aortic insufficiency.  Since last seen he denies dyspnea on exertion, orthopnea, PND, pedal edema, exertional chest pain or syncope.  Current Outpatient Medications  Medication Sig Dispense Refill   amLODipine  (NORVASC ) 2.5 MG tablet Take 1 tablet (2.5 mg total) by mouth daily. 90 tablet 3   atorvastatin  (LIPITOR) 40 MG tablet Take 1 tablet (40 mg total) by mouth daily. 90 tablet 3   irbesartan  (AVAPRO ) 300 MG tablet Take 1 tablet (300 mg total) by mouth daily. 90 tablet 3   JARDIANCE 10 MG TABS tablet Take 10 mg by mouth daily.     metFORMIN (GLUCOPHAGE-XR) 500 MG 24 hr tablet Take 500-1,000 mg by mouth See admin instructions. Take 1000 mg in the morning and 500 mg in the evening     metoprolol  succinate (TOPROL -XL) 100 MG 24 hr tablet Take 1 tablet (100 mg total) by mouth daily. Take with or immediately following a meal. 90 tablet 3   Na Sulfate-K Sulfate-Mg Sulfate concentrate (SUPREP) 17.5-3.13-1.6 GM/177ML SOLN Take by mouth.     phenazopyridine  (PYRIDIUM ) 200 MG tablet Take 1 tablet (200 mg total) by mouth 3 (three) times daily as needed. (Patient not taking: Reported on 10/29/2023) 20 tablet 0   No current facility-administered medications for this visit.     Past Medical History:  Diagnosis Date   Adopted    Allergy    Anginal pain (HCC)    Bladder cancer (HCC)    Cataract    Coronary artery disease    Diabetes mellitus without complication (HCC)    History of bladder cancer 05/30/2017   History of kidney cancer 05/30/2017   Hypertension    Kidney carcinoma [facility])     Past Surgical  History:  Procedure Laterality Date   BLADDER SURGERY     x 6   BYPASS GRAFT AORTA TO AORTA  2024   CYSTOSCOPY     7 or 8  surgeries   CYSTOSCOPY W/ RETROGRADES Left 09/29/2012   Procedure: CYSTOSCOPY WITH LEFT  RETROGRADE PYELOGRAM;  Surgeon: Noretta Ferrara, MD;  Location: [facility];  Service: Urology;  Laterality: Left;   CYSTOSCOPY W/ RETROGRADES Bilateral 07/11/2023   Procedure: CYSTOSCOPY, WITH LEFT RETROGRADE PYELOGRAM;  Surgeon: Ferrara Glance, MD;  Location: [facility];  Service: Urology;  Laterality: Bilateral;   CYSTOSCOPY WITH BIOPSY N/A 09/29/2012   Procedure: CYSTOSCOPY WITH BIOPSY;  Surgeon: Noretta Ferrara, MD;  Location: [facility];  Service: Urology;  Laterality: N/A;  BLADDER BIOPSIES     INGUINAL HERNIA REPAIR Bilateral    as child   NEPHRECTOMY Right    TRANSURETHRAL RESECTION OF BLADDER TUMOR N/A 07/11/2023   Procedure: TURBT (TRANSURETHRAL RESECTION OF BLADDER TUMOR);  Surgeon: Ferrara Glance, MD;  Location: [facility];  Service: Urology;  Laterality: N/A;    Social History   Socioeconomic History   Marital status: Married    Spouse name: Not on file   Number of children: 2   Years of education: Not on file   Highest education level: Not on file  Occupational History   Occupation: Graphics  Tobacco Use   Smoking status: Every  Day    Current packs/day: 0.50    Average packs/day: 0.5 packs/day for 40.0 years (20.0 ttl pk-yrs)    Types: Cigarettes   Smokeless tobacco: Never  Vaping Use   Vaping status: Never Used  Substance and Sexual Activity   Alcohol use: Yes    Comment: Daily  1-2 per day   Drug use: No   Sexual activity: Not on file  Other Topics Concern   Not on file  Social History Narrative   Not on file   Social Drivers of Health   Financial Resource Strain: Low Risk  (05/05/2022)   Received from [facility] Short Social Needs Screening - Medical Financial Resource Strain    Would you like help with any of the following needs? (Select ALL that apply): I  don't want help with any of these  Food Insecurity: No Food Insecurity (05/05/2022)   Received from [facility] Short Social Needs Screening - Food Insecurity    Would you like help with any of the following needs? (Select ALL that apply): I don't want help with any of these  Transportation Needs: No Transportation Needs (05/05/2022)   Received from [facility] Short Social Needs Screening - Transportation    Would you like help with any of the following needs? (Select ALL that apply): I don't want help with any of these  Physical Activity: Not on file  Stress: Not on file  Social Connections: Moderately Integrated (05/05/2022)   Received from [facility] Short Social Needs Screening - Social Connection    Would you like help with any of the following needs? (Select ALL that apply): I don't want help with any of these  Intimate Partner Violence: Not on file (05/09/2022)    Family History  Adopted: Yes  Problem Relation Age of Onset   Colon cancer Neg Hx    Colon polyps Neg Hx    Esophageal cancer Neg Hx    Rectal cancer Neg Hx    Stomach cancer Neg Hx     ROS: no fevers or chills, productive cough, hemoptysis, dysphasia, odynophagia, melena, hematochezia, dysuria, hematuria, rash, seizure activity, orthopnea, PND, pedal edema, claudication. Remaining systems are negative.  Physical Exam: Well-developed well-nourished in no acute distress.  Skin is warm and dry.  HEENT is normal.  Neck is supple.  Chest is clear to auscultation with normal expansion.  Cardiovascular exam is regular rate and rhythm.  Abdominal exam nontender or distended. No masses palpated.  Positive bruit Extremities show no edema. neuro grossly intact  A/P  1 coronary artery disease status post coronary bypass graft-patient doing well with no chest pain.  Continue aspirin and statin.  2 hyperlipidemia-continue statin.  3 hypertension-blood pressure elevated.  Increase amlodipine  to  10 mg daily and follow-up.  4 tobacco abuse-patient counseled on discontinuing.  5 abdominal bruit-schedule abdominal ultrasound to rule out aneurysm.  Redell Shallow, MD

## 2023-10-29 ENCOUNTER — Ambulatory Visit: Attending: Cardiology | Admitting: Cardiology

## 2023-10-29 ENCOUNTER — Encounter: Payer: Self-pay | Admitting: Cardiology

## 2023-10-29 VITALS — BP 166/102 | HR 84 | Ht 70.0 in | Wt 165.3 lb

## 2023-10-29 DIAGNOSIS — R0989 Other specified symptoms and signs involving the circulatory and respiratory systems: Secondary | ICD-10-CM

## 2023-10-29 DIAGNOSIS — I10 Essential (primary) hypertension: Secondary | ICD-10-CM | POA: Diagnosis not present

## 2023-10-29 DIAGNOSIS — I25810 Atherosclerosis of coronary artery bypass graft(s) without angina pectoris: Secondary | ICD-10-CM | POA: Diagnosis not present

## 2023-10-29 DIAGNOSIS — E785 Hyperlipidemia, unspecified: Secondary | ICD-10-CM

## 2023-10-29 MED ORDER — AMLODIPINE BESYLATE 5 MG PO TABS: 5.0000 mg | ORAL_TABLET | Freq: Every day | ORAL | 1 refills | Status: AC

## 2023-10-29 NOTE — Patient Instructions (Addendum)
 Medication Instructions:  Increase: Amlodipine  (Norvasc ) to 5 mg, one tablet, once daily *If you need a refill on your cardiac medications before your next appointment, please call your pharmacy*  Lab Work: None  Testing/Procedures: Ultrasound Aorta Duplex Complete  Your physician has requested that you have an abdominal aorta duplex. During this test, an ultrasound is used to evaluate the aorta. Allow 30 minutes for this exam. Do not eat after midnight the day before and avoid carbonated beverages.  Please note: We ask at that you not bring children with you during ultrasound (echo/ vascular) testing. Due to room size and safety concerns, children are not allowed in the ultrasound rooms during exams. Our front office staff cannot provide observation of children in our lobby area while testing is being conducted. An adult accompanying a patient to their appointment will only be allowed in the ultrasound room at the discretion of the ultrasound technician under special circumstances. We apologize for any inconvenience.   Follow-Up: At [facility], you and your health needs are our priority.  As part of our continuing mission to provide you with exceptional heart care, our providers are all part of one team.  This team includes your primary Cardiologist (physician) and Advanced Practice Providers or APPs (Physician Assistants and Nurse Practitioners) who all work together to provide you with the care you need, when you need it.  Your next appointment:   1 year(s)  Provider:   Redell Shallow, MD   We recommend signing up for the patient portal called MyChart.  Sign up information is provided on this After Visit Summary.  MyChart is used to connect with patients for Virtual Visits (Telemedicine).  Patients are able to view lab/test results, encounter notes, upcoming appointments, etc.  Non-urgent messages can be sent to your provider as well.   To learn more about what you can do with  MyChart, go to [email or web address].   Other Instructions Please call us  or send a MyChart message with any Cardiology related questions/concerns.  [phone].  Thank you!

## 2023-10-29 NOTE — Addendum Note (Signed)
 Addended by: LORRENE FEDERICO CROME on: 10/29/2023 09:38 AM   Modules accepted: Orders

## 2023-11-14 ENCOUNTER — Ambulatory Visit: Payer: Self-pay | Admitting: Cardiology

## 2023-11-14 ENCOUNTER — Ambulatory Visit (HOSPITAL_COMMUNITY)
Admission: RE | Admit: 2023-11-14 | Discharge: 2023-11-14 | Disposition: A | Discharge status: Home / Self Care | Source: Ambulatory Visit | Attending: Cardiology | Admitting: Cardiology

## 2023-11-14 DIAGNOSIS — R0989 Other specified symptoms and signs involving the circulatory and respiratory systems: Secondary | ICD-10-CM | POA: Diagnosis not present

## 2024-01-01 DIAGNOSIS — Z79899 Other long term (current) drug therapy: Secondary | ICD-10-CM | POA: Diagnosis not present

## 2024-01-01 DIAGNOSIS — Z6824 Body mass index (BMI) 24.0-24.9, adult: Secondary | ICD-10-CM | POA: Diagnosis not present

## 2024-01-01 DIAGNOSIS — I10 Essential (primary) hypertension: Secondary | ICD-10-CM | POA: Diagnosis not present

## 2024-01-01 DIAGNOSIS — E1122 Type 2 diabetes mellitus with diabetic chronic kidney disease: Secondary | ICD-10-CM | POA: Diagnosis not present

## 2024-02-07 ENCOUNTER — Emergency Department (HOSPITAL_COMMUNITY)
Admission: EM | Admit: 2024-02-07 | Discharge: 2024-02-07 | Disposition: A | Discharge status: Home / Self Care | Attending: Emergency Medicine | Admitting: Emergency Medicine

## 2024-02-07 ENCOUNTER — Emergency Department (HOSPITAL_COMMUNITY)

## 2024-02-07 ENCOUNTER — Encounter (HOSPITAL_COMMUNITY): Payer: Self-pay

## 2024-02-07 DIAGNOSIS — Z7984 Long term (current) use of oral hypoglycemic drugs: Secondary | ICD-10-CM | POA: Diagnosis not present

## 2024-02-07 DIAGNOSIS — R7401 Elevation of levels of liver transaminase levels: Secondary | ICD-10-CM | POA: Diagnosis not present

## 2024-02-07 DIAGNOSIS — Z955 Presence of coronary angioplasty implant and graft: Secondary | ICD-10-CM | POA: Diagnosis not present

## 2024-02-07 DIAGNOSIS — Z85528 Personal history of other malignant neoplasm of kidney: Secondary | ICD-10-CM | POA: Diagnosis not present

## 2024-02-07 DIAGNOSIS — I10 Essential (primary) hypertension: Secondary | ICD-10-CM | POA: Insufficient documentation

## 2024-02-07 DIAGNOSIS — Z79899 Other long term (current) drug therapy: Secondary | ICD-10-CM | POA: Diagnosis not present

## 2024-02-07 DIAGNOSIS — F17200 Nicotine dependence, unspecified, uncomplicated: Secondary | ICD-10-CM | POA: Insufficient documentation

## 2024-02-07 DIAGNOSIS — R0789 Other chest pain: Secondary | ICD-10-CM | POA: Insufficient documentation

## 2024-02-07 DIAGNOSIS — I2510 Atherosclerotic heart disease of native coronary artery without angina pectoris: Secondary | ICD-10-CM | POA: Diagnosis not present

## 2024-02-07 DIAGNOSIS — E119 Type 2 diabetes mellitus without complications: Secondary | ICD-10-CM | POA: Diagnosis not present

## 2024-02-07 DIAGNOSIS — I700 Atherosclerosis of aorta: Secondary | ICD-10-CM | POA: Diagnosis not present

## 2024-02-07 DIAGNOSIS — Z8551 Personal history of malignant neoplasm of bladder: Secondary | ICD-10-CM | POA: Insufficient documentation

## 2024-02-07 DIAGNOSIS — R079 Chest pain, unspecified: Secondary | ICD-10-CM | POA: Diagnosis not present

## 2024-02-07 DIAGNOSIS — R059 Cough, unspecified: Secondary | ICD-10-CM | POA: Diagnosis not present

## 2024-02-07 LAB — BASIC METABOLIC PANEL WITH GFR
Anion gap: 15 (ref 5–15)
BUN: 16 mg/dL (ref 8–23)
CO2: 21 mmol/L — ABNORMAL LOW (ref 22–32)
Calcium: 9.4 mg/dL (ref 8.9–10.3)
Chloride: 103 mmol/L (ref 98–111)
Creatinine, Ser: 1 mg/dL (ref 0.61–1.24)
GFR, Estimated: >60 mL/min (ref >60)
Glucose, Bld: 134 mg/dL — ABNORMAL HIGH (ref 70–99)
Potassium: 4.6 mmol/L (ref 3.5–5.1)
Sodium: 139 mmol/L (ref 135–145)

## 2024-02-07 LAB — CBC
HCT: 49.9 % (ref 39.0–52.0)
Hemoglobin: 16.7 g/dL (ref 13.0–17.0)
MCH: 32.3 pg (ref 26.0–34.0)
MCHC: 33.5 g/dL (ref 30.0–36.0)
MCV: 96.5 fL (ref 80.0–100.0)
Platelets: 166 K/uL (ref 150–400)
RBC: 5.17 MIL/uL (ref 4.22–5.81)
RDW: 13.2 % (ref 11.5–15.5)
WBC: 8.8 K/uL (ref 4.0–10.5)
nRBC: 0 % (ref 0.0–0.2)

## 2024-02-07 LAB — HEPATIC FUNCTION PANEL
ALT: 52 U/L — ABNORMAL HIGH (ref 0–44)
AST: 35 U/L (ref 15–41)
Albumin: 4 g/dL (ref 3.5–5.0)
Alkaline Phosphatase: 87 U/L (ref 38–126)
Bilirubin, Direct: 0.2 mg/dL (ref 0.0–0.2)
Indirect Bilirubin: 1.1 mg/dL — ABNORMAL HIGH (ref 0.3–0.9)
Total Bilirubin: 1.3 mg/dL — ABNORMAL HIGH (ref 0.0–1.2)
Total Protein: 7.3 g/dL (ref 6.5–8.1)

## 2024-02-07 LAB — LIPASE, BLOOD: Lipase: 39 U/L (ref 11–51)

## 2024-02-07 LAB — D-DIMER, QUANTITATIVE: D-Dimer, Quant: 0.32 ug{FEU}/mL (ref 0.00–0.50)

## 2024-02-07 LAB — TROPONIN I (HIGH SENSITIVITY)
Troponin I (High Sensitivity): 6 ng/L (ref <18)
Troponin I (High Sensitivity): 5 ng/L (ref <18)

## 2024-02-07 NOTE — ED Provider Triage Note (Signed)
 Emergency Medicine Provider Triage Evaluation Note  Grant Watkins , a 68 y.o. male  was evaluated in triage.  Pt complains of chest pain.  Review of Systems  Positive: A bandlike sharp chest pain across his chest for the last week.  Episodes of diaphoresis.  Right hand tingling similar to previous MI.  Does have some pain in his epigastric area. Negative: Shortness of breath, long distance travel, leg pain, leg swelling, fevers, chills, cough, trauma, rashes.  Physical Exam  BP (!) 174/95 (BP Location: Right Arm)   Pulse 85   Temp 98.1 F (36.7 C)   Resp 18   Ht 5' 10 (1.778 m)   Wt 74.8 kg   SpO2 98%   BMI 23.68 kg/m  Gen:   Awake, no distress Resp:  Normal effort, clear breath sounds, no rhonchi rales or wheezing.  No murmur on my exam MSK:   Moves extremities without difficulty, and is across his chest Other:  No focal abdominal tenderness but he has some epigastric pain  Medical Decision Making  Medically screening exam initiated at 9:07 AM.  Appropriate orders placed.  Grant Watkins was informed that the remainder of the evaluation will be completed by another provider, this initial triage assessment does not replace that evaluation, and the importance of remaining in the ED until their evaluation is complete.  Grant Watkins is a 68 y.o. male with a past medical history significant for hypertension, CAD with previous CABG, previous bladder tumor status postresection, kidney carcinoma status post right nephrectomy, diabetes, and previous inguinal hernia repair who presents with chest pain.  Going to patient, for the last week he is having intermittent chest discomfort that is sharp and going across his chest.  Is associated with diaphoresis and also goes to his epigastric area slightly.  He reports his chest wall is slightly tender he denies any trauma.  Denies rash to suggest shingles.  Denies fevers, chills, concerns, or cough.  Reports it does not seem to be pleuritic or  exertional however it is a sharp band across his chest.  He reports he is having some right hand tingling which is the same symptom he had when he had his previous MI.  He has not called his cardiologist and has not taken any medicine help with symptoms.  He reports earlier on in the week it was coming and going but it has been more constant for the last 48 hours.  He does report it woke him up this morning with the pain but it has subsided since come to the emergency department.  He reports it was up to a 7 out of 10 in severity earlier.  He denies any nausea, vomiting, constipation, diarrhea, or urinary changes.  No history of blood clots.  No recent travel.  Denies other complaints on arrival.  EKG initially does not show STEMI.  On my exam, lungs clear.  Chest is tender to palpation I cell no rash.  Abdomen is nontender.  He points to the pain going across his epigastric area as well.  Intact pulses in extremity.  Legs nontender and nonedematous.  Patient otherwise well-appearing.  Due to his history of previous MI and CABG with pain in his chest he will get workup including x-ray and labs.  With the pain going to the epigastric area we will add on hepatic function and lipase.  With his report of sharp pain that is a band across his chest and his age he will get  a D-dimer to rule out a thromboembolic etiology.  He is not going to his back so low suspicion for aortic etiology initially.  The tingling in his hand seeming similar to previous MI we will get troponins.  Anticipate full assessment by provider when he gets to a room.   [name], [name], MD 02/07/24 [phone]

## 2024-02-07 NOTE — ED Triage Notes (Signed)
 Pt c/o generalized chest pain and tingling in R hand x1 week.  Pain score 3/10.  Pt reports the pain was intermittent but has become constant.  Hx of bypass in 2024.

## 2024-02-07 NOTE — ED Notes (Signed)
Pt. Transported to xray 

## 2024-02-07 NOTE — ED Provider Notes (Signed)
 [facility] EMERGENCY DEPARTMENT AT [facility] Provider Note   CSN: [id number] Arrival date & time: 02/07/24  9165     Patient presents with: Chest Pain and Tingling   Grant Watkins is a 68 y.o. male who presents to the ED today secondary to bandlike chest pain across the lower portion of his chest.  States the pain is intermittent, sharp pain that goes directly across the chest, originating substernally, and present in both the left and right sides of the chest.  He denies any recent injuries, has had an intermittent cough with some phlegm produced but does smoke a half a pack per day.  He also does have a previous medical history of CAD with previous bypass approximately 1-1/2 years ago, he also has history of diabetes, and is status post inguinal hernia repair.  Denies have any nausea or vomiting, denies having any loss of appetite though has not had anything to eat today as he has been here in the hospital since early this morning.  Review of previous medical history shows diagnosis of bladder cancer, CAD, type 2 diabetes, and hypertension.  Has also had renal cell carcinoma.  Previous nephrectomy secondary to renal cell carcinoma, previous CABG in 2024.    Chest Pain      Prior to Admission medications   Medication Sig Start Date End Date Taking? Authorizing Provider  amLODipine  (NORVASC ) 5 MG tablet Take 1 tablet (5 mg total) by mouth daily. 10/29/23 01/27/24  Pietro Redell RAMAN, MD  atorvastatin  (LIPITOR) 40 MG tablet Take 1 tablet (40 mg total) by mouth daily. 02/21/23 10/29/23  Meng, Hao, PA  irbesartan  (AVAPRO ) 300 MG tablet Take 1 tablet (300 mg total) by mouth daily. 04/05/23   Meng, Hao, PA  JARDIANCE 10 MG TABS tablet Take 10 mg by mouth daily.    [provider]  metFORMIN (GLUCOPHAGE-XR) 500 MG 24 hr tablet Take 500-1,000 mg by mouth See admin instructions. Take 1000 mg in the morning and 500 mg in the evening    [provider]  metoprolol   succinate (TOPROL -XL) 100 MG 24 hr tablet Take 1 tablet (100 mg total) by mouth daily. Take with or immediately following a meal. 06/19/23 10/29/23  Janene Boer, PA  Na Sulfate-K Sulfate-Mg Sulfate concentrate (SUPREP) 17.5-3.13-1.6 GM/177ML SOLN Take by mouth. 08/26/23   [provider]  phenazopyridine  (PYRIDIUM ) 200 MG tablet Take 1 tablet (200 mg total) by mouth 3 (three) times daily as needed. Patient not taking: Reported on 10/29/2023 07/11/23   Renda Glance, MD    Allergies: Patient has no known allergies.    Review of Systems  Cardiovascular:  Positive for chest pain.  All other systems reviewed and are negative.   Updated Vital Signs BP (!) 107/58 (BP Location: Left Arm)   Pulse (!) 53   Temp 98.3 F (36.8 C)   Resp 16   Ht 5' 10 (1.778 m)   Wt 74.8 kg   SpO2 95%   BMI 23.68 kg/m   Physical Exam Vitals and nursing note reviewed.  Constitutional:      General: He is not in acute distress.    Appearance: Normal appearance. He is well-developed and normal weight.  HENT:     Head: Normocephalic and atraumatic.     Mouth/Throat:     Mouth: Mucous membranes are moist.     Pharynx: Oropharynx is clear.  Eyes:     Extraocular Movements: Extraocular movements intact.     Conjunctiva/sclera: Conjunctivae normal.  Pupils: Pupils are equal, round, and reactive to light.  Cardiovascular:     Rate and Rhythm: Normal rate and regular rhythm.     Pulses: Normal pulses.          Carotid pulses are 2+ on the right side and 2+ on the left side.      Radial pulses are 2+ on the right side and 2+ on the left side.     Heart sounds: Normal heart sounds. No murmur heard.    No friction rub. No gallop.  Pulmonary:     Effort: Pulmonary effort is normal.     Breath sounds: Normal breath sounds.  Abdominal:     General: Abdomen is flat. Bowel sounds are normal.     Palpations: Abdomen is soft.  Musculoskeletal:        General: Normal range of motion.     Cervical back:  Normal range of motion and neck supple.     Right lower leg: No edema.     Left lower leg: No edema.  Skin:    General: Skin is warm and dry.     Capillary Refill: Capillary refill takes less than 2 seconds.  Neurological:     General: No focal deficit present.     Mental Status: He is alert. Mental status is at baseline.  Psychiatric:        Mood and Affect: Mood normal.     (all labs ordered are listed, but only abnormal results are displayed) Labs Reviewed  BASIC METABOLIC PANEL WITH GFR - Abnormal; Notable for the following components:      Result Value   CO2 21 (*)    Glucose, Bld 134 (*)    All other components within normal limits  HEPATIC FUNCTION PANEL - Abnormal; Notable for the following components:   ALT 52 (*)    Total Bilirubin 1.3 (*)    Indirect Bilirubin 1.1 (*)    All other components within normal limits  CBC  LIPASE, BLOOD  D-DIMER, QUANTITATIVE  TROPONIN I (HIGH SENSITIVITY)  TROPONIN I (HIGH SENSITIVITY)    EKG: None  Radiology: DG Chest 2 View Result Date: 02/07/2024 EXAM: 2 VIEW(S) XRAY OF THE CHEST 02/07/2024 10:19:00 AM COMPARISON: 01/04/2016. CLINICAL HISTORY: chest pain FINDINGS: LUNGS AND PLEURA: Hyperinflation. No focal pulmonary opacity. No pleural effusion. No pneumothorax. HEART AND MEDIASTINUM: Calcified aorta. Sternal wires with left atrial appendage occlusion clip noted. BONES AND SOFT TISSUES: No acute osseous abnormality. IMPRESSION: 1. No acute cardiopulmonary process identified. 2. Hyperinflation. Electronically signed by: Ryan Chess MD 02/07/2024 11:28 AM EST RP Workstation: [id number]     Procedures   Medications Ordered in the ED - No data to display                                  Medical Decision Making Amount and/or Complexity of Data Reviewed Labs: ordered. Radiology: ordered.   Medical Decision Making:   Grant Watkins is a 68 y.o. male who presented to the ED today with chest pain detailed above.     Additional history discussed with patient's family/caregivers.  External chart has been reviewed including previous labs, imaging, echocardiograms. Patient's presentation is complicated by their history of CAD, previous CABG.  Complete initial physical exam performed, notably the patient  was alert oriented no apparent distress.  Cardiac and pulmonary auscultation are unremarkable, physical exam largely benign..    Reviewed and confirmed  nursing documentation for past medical history, family history, social history.    Initial Assessment:   With the patient's presentation of chest pain, consider cardiac etiology, specifically CAD/ACS/STEMI.  Also consider pulmonary etiology of chest discomfort, pneumonia, pneumothorax, pleural effusion.  Consider hepatobiliary cause of lower thoracic pain.   Initial Plan:  At triage assessment, D-dimer was ordered secondary to chest discomfort and cough. Screening labs including CBC and Metabolic panel to evaluate for infectious or metabolic etiology of disease.  Include serum lipase to evaluate for pancreatic etiology of lower thoracic pain. CXR to evaluate for structural/infectious intrathoracic pathology.  EKG and serial troponin to evaluate for cardiac pathology. Objective evaluation as below reviewed   Initial Study Results:   Laboratory  All laboratory results reviewed without evidence of clinically relevant pathology.   Exceptions include: ALT is acutely elevated to 52, with bilirubin elevated to 1.3.  Patient states last alcoholic intake was yesterday evening.  EKG EKG was reviewed independently. Rate, rhythm, axis, intervals all examined and without medically relevant abnormality. ST segments without concerns for elevations.    Radiology:  All images reviewed independently. Agree with radiology report at this time.   DG Chest 2 View Result Date: 02/07/2024 EXAM: 2 VIEW(S) XRAY OF THE CHEST 02/07/2024 10:19:00 AM COMPARISON: 01/04/2016.  CLINICAL HISTORY: chest pain FINDINGS: LUNGS AND PLEURA: Hyperinflation. No focal pulmonary opacity. No pleural effusion. No pneumothorax. HEART AND MEDIASTINUM: Calcified aorta. Sternal wires with left atrial appendage occlusion clip noted. BONES AND SOFT TISSUES: No acute osseous abnormality. IMPRESSION: 1. No acute cardiopulmonary process identified. 2. Hyperinflation. Electronically signed by: Ryan Chess MD 02/07/2024 11:28 AM EST RP Workstation: [id number]     Reassessment and Plan:   Chest x-ray and EKG are largely unremarkable, chest x-ray did not show signs of hyperinflation and then in line with patient's smoking history and productive cough with occasional thick and sputum, findings consistent with possible development of COPD and will refer to primary care for pulmonary function testing.  Discussed this with the patient which they understand and agree.  The ALT is elevated to 52 as well as a total bilirubin elevation to 1.3.  This does represent an acute change as this was normal as of 7 months ago.  He does endorse frequent alcohol intake and recent alcohol intake is recent as yesterday evening.  Discussed alcohol cessation with the patient, and refer for reevaluation at primary care within 2 weeks.  Careful return precautions were given to the patient for development of nausea, worsening chest pain, and he voices understanding and agreement with this plan.  He still does have normal appetite, though has not been able to eat as he has been here in the emergency department since 8 AM.  D-dimer was negative, therefore no further imaging was warranted given the patient signs and symptoms as well as physical exam in concert with this finding.  Again careful return precautions were given to the patient regarding worsening shortness of breath, of which he has no shortness of breath at this time.  He has cardiology following with Dr. Pietro, advised him to schedule an appointment with Dr.  Pietro for further cardiac evaluation.  Given the reassuring findings of the lab workup and imaging, find that he has nonspecific chest wall pain, also noted transaminitis which may be causative of his chest pain.  As previously noted have encouraged alcohol cessation, follow-up to primary care, and careful return precautions given to the patient.  Again they understand agree of no further concerns  at this time, findings stable for discharge.       Final diagnoses:  Chest wall pain  Transaminitis    ED Discharge Orders     None          Myriam Dorn BROCKS, [place] 02/07/24 1350    Rogelia Jerilynn RAMAN, MD 02/10/24 1355

## 2024-02-07 NOTE — Discharge Instructions (Signed)
 As discussed, please follow-up with your primary care provider for evaluation of your breathing status, specifically breathing testing to evaluate for possible COPD.  Further, encourage you to cease alcohol intake as your liver enzymes were mildly elevated and may be causative of some of your chest discomfort.  Cardiac workup at this time is unremarkable however still encouraged follow-up with cardiology as you have been following them recently, given your persistent chest pain would recommend follow-up with cardiology as well.  Please return to the emergency department should you have worsening pain, shortness of breath, or any other new signs or symptoms any concerning for you.

## 2024-02-12 ENCOUNTER — Other Ambulatory Visit: Payer: Self-pay | Admitting: Physician Assistant

## 2024-02-17 ENCOUNTER — Other Ambulatory Visit: Payer: Self-pay | Admitting: Physician Assistant

## 2024-02-21 DIAGNOSIS — N402 Nodular prostate without lower urinary tract symptoms: Secondary | ICD-10-CM | POA: Diagnosis not present

## 2024-03-29 ENCOUNTER — Other Ambulatory Visit: Payer: Self-pay | Admitting: Physician Assistant
